# Patient Record
Sex: Female | Born: 1950
Health system: Southern US, Community
[De-identification: ages and names within clinical notes are randomized; demographics above are authoritative.]

## PROBLEM LIST (undated history)

## (undated) DIAGNOSIS — R42 Dizziness and giddiness: Secondary | ICD-10-CM

## (undated) DIAGNOSIS — L4050 Arthropathic psoriasis, unspecified: Secondary | ICD-10-CM

## (undated) DIAGNOSIS — R202 Paresthesia of skin: Secondary | ICD-10-CM

## (undated) DIAGNOSIS — E7800 Pure hypercholesterolemia, unspecified: Secondary | ICD-10-CM

## (undated) DIAGNOSIS — L409 Psoriasis, unspecified: Secondary | ICD-10-CM

## (undated) DIAGNOSIS — M7730 Calcaneal spur, unspecified foot: Secondary | ICD-10-CM

## (undated) DIAGNOSIS — C4490 Unspecified malignant neoplasm of skin, unspecified: Secondary | ICD-10-CM

## (undated) DIAGNOSIS — M1990 Unspecified osteoarthritis, unspecified site: Secondary | ICD-10-CM

## (undated) DIAGNOSIS — E079 Disorder of thyroid, unspecified: Secondary | ICD-10-CM

## (undated) DIAGNOSIS — G4730 Sleep apnea, unspecified: Secondary | ICD-10-CM

## (undated) DIAGNOSIS — M722 Plantar fascial fibromatosis: Secondary | ICD-10-CM

## (undated) HISTORY — DX: Calcaneal spur, unspecified foot: M77.30

## (undated) HISTORY — DX: Plantar fascial fibromatosis: M72.2

## (undated) HISTORY — PX: ABDOMINAL HYSTERECTOMY: SHX81

## (undated) HISTORY — DX: Psoriasis, unspecified: L40.9

## (undated) HISTORY — DX: Unspecified malignant neoplasm of skin, unspecified: C44.90

## (undated) HISTORY — DX: Paresthesia of skin: R20.2

## (undated) HISTORY — DX: Arthropathic psoriasis, unspecified: L40.50

## (undated) HISTORY — DX: Sleep apnea, unspecified: G47.30

## (undated) HISTORY — DX: Disorder of thyroid, unspecified: E07.9

## (undated) HISTORY — DX: Unspecified osteoarthritis, unspecified site: M19.90

## (undated) HISTORY — PX: BACK SURGERY: SHX140

## (undated) HISTORY — DX: Pure hypercholesterolemia, unspecified: E78.00

## (undated) HISTORY — DX: Dizziness and giddiness: R42

---

## 1990-06-05 HISTORY — PX: ABDOMINAL HYSTERECTOMY: SHX81

## 1992-06-04 HISTORY — PX: BACK SURGERY: SHX140

## 1999-01-20 ENCOUNTER — Other Ambulatory Visit: Admission: RE | Admit: 1999-01-20 | Discharge: 1999-01-20 | Payer: Self-pay | Admitting: Obstetrics and Gynecology

## 2000-02-11 ENCOUNTER — Other Ambulatory Visit: Admission: RE | Admit: 2000-02-11 | Discharge: 2000-02-11 | Payer: Self-pay | Admitting: Obstetrics and Gynecology

## 2000-09-16 ENCOUNTER — Ambulatory Visit (HOSPITAL_COMMUNITY): Admission: RE | Admit: 2000-09-16 | Discharge: 2000-09-16 | Payer: Self-pay | Admitting: Internal Medicine

## 2000-09-16 ENCOUNTER — Encounter: Payer: Self-pay | Admitting: Internal Medicine

## 2001-02-21 ENCOUNTER — Encounter: Payer: Self-pay | Admitting: Obstetrics and Gynecology

## 2001-02-21 ENCOUNTER — Ambulatory Visit (HOSPITAL_COMMUNITY): Admission: RE | Admit: 2001-02-21 | Discharge: 2001-02-21 | Payer: Self-pay | Admitting: Obstetrics and Gynecology

## 2001-03-20 ENCOUNTER — Other Ambulatory Visit: Admission: RE | Admit: 2001-03-20 | Discharge: 2001-03-20 | Payer: Self-pay | Admitting: Obstetrics and Gynecology

## 2002-03-14 ENCOUNTER — Encounter: Payer: Self-pay | Admitting: Obstetrics and Gynecology

## 2002-03-14 ENCOUNTER — Ambulatory Visit (HOSPITAL_COMMUNITY): Admission: RE | Admit: 2002-03-14 | Discharge: 2002-03-14 | Payer: Self-pay | Admitting: Obstetrics and Gynecology

## 2003-04-12 ENCOUNTER — Ambulatory Visit (HOSPITAL_COMMUNITY): Admission: RE | Admit: 2003-04-12 | Discharge: 2003-04-12 | Payer: Self-pay | Admitting: Obstetrics and Gynecology

## 2003-10-04 ENCOUNTER — Emergency Department (HOSPITAL_COMMUNITY): Admission: EM | Admit: 2003-10-04 | Discharge: 2003-10-04 | Payer: Self-pay | Admitting: Gastroenterology

## 2004-10-06 ENCOUNTER — Ambulatory Visit (HOSPITAL_COMMUNITY): Admission: RE | Admit: 2004-10-06 | Discharge: 2004-10-06 | Payer: Self-pay | Admitting: Obstetrics and Gynecology

## 2005-10-22 ENCOUNTER — Ambulatory Visit (HOSPITAL_COMMUNITY): Admission: RE | Admit: 2005-10-22 | Discharge: 2005-10-22 | Payer: Self-pay | Admitting: Obstetrics and Gynecology

## 2006-11-21 ENCOUNTER — Ambulatory Visit (HOSPITAL_COMMUNITY): Admission: RE | Admit: 2006-11-21 | Discharge: 2006-11-21 | Payer: Self-pay | Admitting: Obstetrics and Gynecology

## 2006-12-07 ENCOUNTER — Ambulatory Visit (HOSPITAL_COMMUNITY): Admission: RE | Admit: 2006-12-07 | Discharge: 2006-12-07 | Payer: Self-pay | Admitting: Obstetrics and Gynecology

## 2008-07-09 ENCOUNTER — Ambulatory Visit (HOSPITAL_COMMUNITY): Admission: RE | Admit: 2008-07-09 | Discharge: 2008-07-09 | Payer: Self-pay | Admitting: Obstetrics and Gynecology

## 2009-07-25 ENCOUNTER — Ambulatory Visit (HOSPITAL_COMMUNITY): Admission: RE | Admit: 2009-07-25 | Discharge: 2009-07-25 | Payer: Self-pay | Admitting: Obstetrics and Gynecology

## 2009-11-11 ENCOUNTER — Ambulatory Visit (HOSPITAL_COMMUNITY): Admission: RE | Admit: 2009-11-11 | Discharge: 2009-11-11 | Payer: Self-pay | Admitting: Orthopedic Surgery

## 2009-11-14 ENCOUNTER — Encounter (HOSPITAL_COMMUNITY)
Admission: RE | Admit: 2009-11-14 | Discharge: 2009-12-14 | Payer: Self-pay | Source: Home / Self Care | Attending: Orthopedic Surgery | Admitting: Orthopedic Surgery

## 2009-12-17 ENCOUNTER — Encounter (HOSPITAL_COMMUNITY)
Admission: RE | Admit: 2009-12-17 | Discharge: 2010-01-16 | Payer: Self-pay | Source: Home / Self Care | Attending: Orthopedic Surgery | Admitting: Orthopedic Surgery

## 2010-01-26 ENCOUNTER — Encounter: Payer: Self-pay | Admitting: Obstetrics and Gynecology

## 2010-01-27 ENCOUNTER — Encounter (HOSPITAL_COMMUNITY)
Admission: RE | Admit: 2010-01-27 | Discharge: 2010-02-03 | Payer: Self-pay | Source: Home / Self Care | Attending: Orthopedic Surgery | Admitting: Orthopedic Surgery

## 2010-02-05 ENCOUNTER — Encounter (HOSPITAL_COMMUNITY)
Admission: RE | Admit: 2010-02-05 | Discharge: 2010-02-05 | Disposition: A | Discharge status: Home / Self Care | Payer: BC Managed Care – PPO | Source: Ambulatory Visit | Attending: Orthopedic Surgery | Admitting: Orthopedic Surgery

## 2010-02-05 DIAGNOSIS — IMO0001 Reserved for inherently not codable concepts without codable children: Secondary | ICD-10-CM | POA: Insufficient documentation

## 2010-02-05 DIAGNOSIS — M6281 Muscle weakness (generalized): Secondary | ICD-10-CM | POA: Insufficient documentation

## 2010-02-05 DIAGNOSIS — M25519 Pain in unspecified shoulder: Secondary | ICD-10-CM | POA: Insufficient documentation

## 2010-02-05 DIAGNOSIS — M7500 Adhesive capsulitis of unspecified shoulder: Secondary | ICD-10-CM | POA: Insufficient documentation

## 2010-02-09 ENCOUNTER — Ambulatory Visit (HOSPITAL_COMMUNITY): Payer: Self-pay | Admitting: Occupational Therapy

## 2010-02-18 ENCOUNTER — Ambulatory Visit (HOSPITAL_COMMUNITY)
Admission: RE | Admit: 2010-02-18 | Discharge: 2010-02-18 | Disposition: A | Discharge status: Home / Self Care | Payer: BC Managed Care – PPO | Source: Ambulatory Visit | Attending: Orthopedic Surgery | Admitting: Orthopedic Surgery

## 2010-02-18 DIAGNOSIS — M25619 Stiffness of unspecified shoulder, not elsewhere classified: Secondary | ICD-10-CM | POA: Insufficient documentation

## 2010-02-18 DIAGNOSIS — M6281 Muscle weakness (generalized): Secondary | ICD-10-CM | POA: Insufficient documentation

## 2010-02-18 DIAGNOSIS — M25519 Pain in unspecified shoulder: Secondary | ICD-10-CM | POA: Insufficient documentation

## 2010-02-18 DIAGNOSIS — IMO0001 Reserved for inherently not codable concepts without codable children: Secondary | ICD-10-CM | POA: Insufficient documentation

## 2010-02-25 ENCOUNTER — Ambulatory Visit (HOSPITAL_COMMUNITY): Payer: BC Managed Care – PPO | Admitting: Specialist

## 2010-09-14 ENCOUNTER — Other Ambulatory Visit (HOSPITAL_COMMUNITY): Payer: Self-pay | Admitting: Obstetrics and Gynecology

## 2010-09-14 DIAGNOSIS — Z139 Encounter for screening, unspecified: Secondary | ICD-10-CM

## 2010-09-25 ENCOUNTER — Ambulatory Visit (HOSPITAL_COMMUNITY)
Admission: RE | Admit: 2010-09-25 | Discharge: 2010-09-25 | Disposition: A | Discharge status: Home / Self Care | Payer: BC Managed Care – PPO | Source: Ambulatory Visit | Attending: Obstetrics and Gynecology | Admitting: Obstetrics and Gynecology

## 2010-09-25 DIAGNOSIS — Z1231 Encounter for screening mammogram for malignant neoplasm of breast: Secondary | ICD-10-CM | POA: Insufficient documentation

## 2010-09-25 DIAGNOSIS — Z139 Encounter for screening, unspecified: Secondary | ICD-10-CM

## 2010-10-15 ENCOUNTER — Ambulatory Visit: Payer: BC Managed Care – PPO | Admitting: Orthopedic Surgery

## 2010-10-29 ENCOUNTER — Ambulatory Visit: Payer: BC Managed Care – PPO | Admitting: Orthopedic Surgery

## 2011-04-01 ENCOUNTER — Ambulatory Visit (HOSPITAL_COMMUNITY)
Admission: RE | Admit: 2011-04-01 | Discharge: 2011-04-01 | Disposition: A | Discharge status: Home / Self Care | Payer: BC Managed Care – PPO | Source: Ambulatory Visit | Attending: Orthopedic Surgery | Admitting: Orthopedic Surgery

## 2011-04-01 DIAGNOSIS — M6281 Muscle weakness (generalized): Secondary | ICD-10-CM | POA: Insufficient documentation

## 2011-04-01 DIAGNOSIS — IMO0001 Reserved for inherently not codable concepts without codable children: Secondary | ICD-10-CM | POA: Insufficient documentation

## 2011-04-01 DIAGNOSIS — M25619 Stiffness of unspecified shoulder, not elsewhere classified: Secondary | ICD-10-CM | POA: Insufficient documentation

## 2011-04-01 DIAGNOSIS — M25519 Pain in unspecified shoulder: Secondary | ICD-10-CM | POA: Insufficient documentation

## 2011-04-01 NOTE — Evaluation (Signed)
Physical Therapy Evaluation  Patient Details  Name: CARISA BACKHAUS MRN: [id number] Date of Birth: 09-20-1950  Today's Date: 04/01/2011 Time: 1115-1200 Time Calculation (min): 45 min  Visit#: 1  of 12   Re-eval: 05/01/11 Assessment Diagnosis: R shoulder manipulation  Surgical Date: 03/31/11 Prior Therapy: Conservative Treatment using PT and Chiropractic   Subjective Symptoms/Limitations Symptoms: Pt reports that she had her Rt shoulder manipulation yesterday.  She had a history of Left shoulder manipulation Feb 2012.  R shoulder started to freeze in Septemeber.  Currently taking a hydorcodone to control her pain.  She complains of vertigo symptoms which are positional.  Pain Assessment Currently in Pain?: Yes Pain Score:   2 Pain Location: Shoulder Pain Orientation: Right Pain Type: Acute pain;Surgical pain  Prior Functioning  Prior Function Leisure: Hobbies-yes (Comment) Comments: Recently retired.  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  She enjoys outdoor yard work/gardening.   Cognition/Observation Observation/Other Assessments Observations: Scapular Dyskinesis. Impaired UT activation  Assessment RUE AROM (degrees) RUE Overall AROM Comments: Decreased awareness of shoulder stabilizers Shoulder Flexion: 137 Degrees; ABduction: 120 Degrees; Internal Rotation:  L5 w/lumbar flexion; External Rotation: 30 Degrees RUE PROM (degrees) Shoulder Flexion: 150 Degrees; ABductio: 130 Degree; Internal Rotation: 30 Degrees; External Rotation: 45 Degrees RUE Strength Right Shoulder Flexion: 3/5; Extension: 3/5; ABduction: 3/5;Internal Rotation: 4/5; External Rotation: 4/5  Palpation: Muscular spasms to UT and deltor  Exercise/Treatments Supine External Rotation: AAROM;15 reps (#1 Dowel) Flexion: AAROM;15 reps (#1 dowel)  Manual Therapy Manual Therapy: Joint mobilization Joint Mobilization: R GH Grade II-III to increase flexion and ER.  PROM completed in all directions x30  minutes  Physical Therapy Assessment and Plan PT Assessment and Plan Clinical Impression Statement: Pt is a 61 year old female referred to PT s/p R shoulder manipulation on 03/31/10.  After examination it was found that she has current impairments including increased shoulder pain, decreased R shoulder AROM & strength, R scapular dyskinesis and  impaired motor control of shoulder and scapular stabalizers which are limiting her ability to complete necessary ADL's and IADL's.  She will benefit from skilled OP PT in order to address above impairments to reach functional goals.  Rehab Potential: Good PT Frequency: Min 3X/week PT Duration: 8 weeks PT Treatment/Interventions: Functional mobility training;Therapeutic activities;Therapeutic exercise;Neuromuscular re-education;Patient/family education;Other (comment) (Joint Mobilization, Manual Techniques, Modalities PRN) PT Plan: Taper down to 2x/wk as needed.  Add: UBE for ROM, continue to work and focus on PROM until she gains FROM    Goals Home Exercise Program Pt will Perform Home Exercise Program: Independently PT Goal: Perform Home Exercise Program - Progress: Goal set today PT Short Term Goals Time to Complete Short Term Goals: 2 weeks PT Short Term Goal 1: Pt will improve her AROM to: flexion: 150, abduction: 130 degrees, ER: 35 degrees, IR: L4 spinal level PT Short Term Goal 2: Pt will improve her shoulder strength by 1/2 muscle grade.  PT Short Term Goal 3: Pt will report greater ease with donning UE clothing.  PT Short Term Goal 4: Pt will present with proper shoulder mechanics when lifting a 1# weight from counter to cabinet.  PT Long Term Goals Time to Complete Long Term Goals: 8 weeks PT Long Term Goal 1: Pt will improve her strength to [facility] and demonstrate lifting a 15# weight to chest level with BUE to comfortably be able to play with her grandbaby. PT Long Term Goal 2: Pt will improve her shoulder AROM: Flexion: 160, Abduction: 150,  ER: 60, IR: T10 spinal level in order to complete necessary ADL's including lifting dishes from counter to cabinet and brushing her hair. Long Term Goal 3: Pt will report pain less than or equal to 3/10 for 75% of her day for improved QOL.   Problem List Patient Active Problem List  Diagnoses  . Shoulder pain  . Shoulder stiffness  . Muscle weakness (generalized)    PT Plan of Care PT Home Exercise Plan: Supine: Shoulder flexion and ER with dowel rod, pt reports she has these exercises at home and will complete them.  Consulted and Agree with Plan of Care: Patient  Tykeem Lanzer 04/01/2011, 12:17 PM  Physician Documentation Your signature is required to indicate approval of the treatment plan as stated above.  Please sign and either send electronically or make a copy of this report for your files and return this physician signed original.   Please mark one 1.__approve of plan  2. ___approve of plan with the following conditions.   ______________________________                                                          _____________________ Physician Signature                                                                                                             Date

## 2011-04-02 ENCOUNTER — Ambulatory Visit (HOSPITAL_COMMUNITY)
Admission: RE | Admit: 2011-04-02 | Discharge: 2011-04-02 | Disposition: A | Discharge status: Home / Self Care | Payer: BC Managed Care – PPO | Source: Ambulatory Visit | Attending: Orthopedic Surgery | Admitting: Orthopedic Surgery

## 2011-04-02 NOTE — Progress Notes (Signed)
Physical Therapy Treatment Patient Details  Name: ZULAY CORRIE MRN: [id number] Date of Birth: 1950/02/19  Today's Date: 04/02/2011 Time: 1120-1150 Time Calculation (min): 30 min Charges: manual 20', 10 TE Visit#: 2  of 12   Re-eval: 05/01/11    Subjective: Symptoms/Limitations Symptoms: I am doing pretty well today.  I had a little pain last night, but it wasn't bad.  Pain Assessment Currently in Pain?: Yes Pain Score:   2  Exercise/Treatments Supine Other Supine Exercises: Star Gazers 10x10 sec holds each directio Other Supine Exercises: D2 Flexion and Extension 10x each direction  ROM / Strengthening / Isometric Strengthening UBE (Upper Arm Bike): 4 min (2' back, 2' forward)   Manual Therapy Joint Mobilization: GH Grade II-IV to increase ER, flexion w/PROM to follow.  20 minutes.   Physical Therapy Assessment and Plan PT Assessment and Plan Clinical Impression Statement: Pt had improved shoulder ER after treatment today. Tolerated treatment well without an increase in pain.  PT Plan: Cont to progress    Goals    Problem List Patient Active Problem List  Diagnoses  . Shoulder pain  . Shoulder stiffness  . Muscle weakness (generalized)   Tonika Eden 04/02/2011, 12:03 PM

## 2011-11-08 ENCOUNTER — Other Ambulatory Visit (HOSPITAL_COMMUNITY): Payer: Self-pay | Admitting: Obstetrics and Gynecology

## 2011-11-08 DIAGNOSIS — Z139 Encounter for screening, unspecified: Secondary | ICD-10-CM

## 2011-11-12 ENCOUNTER — Ambulatory Visit (HOSPITAL_COMMUNITY)
Admission: RE | Admit: 2011-11-12 | Discharge: 2011-11-12 | Disposition: A | Discharge status: Home / Self Care | Payer: BC Managed Care – PPO | Source: Ambulatory Visit | Attending: Obstetrics and Gynecology | Admitting: Obstetrics and Gynecology

## 2011-11-12 DIAGNOSIS — Z139 Encounter for screening, unspecified: Secondary | ICD-10-CM

## 2011-11-12 DIAGNOSIS — Z1231 Encounter for screening mammogram for malignant neoplasm of breast: Secondary | ICD-10-CM | POA: Insufficient documentation

## 2012-11-27 ENCOUNTER — Other Ambulatory Visit (HOSPITAL_COMMUNITY): Payer: Self-pay | Admitting: Obstetrics and Gynecology

## 2012-11-27 DIAGNOSIS — Z139 Encounter for screening, unspecified: Secondary | ICD-10-CM

## 2012-11-28 ENCOUNTER — Ambulatory Visit (HOSPITAL_COMMUNITY)
Admission: RE | Admit: 2012-11-28 | Discharge: 2012-11-28 | Disposition: A | Discharge status: Home / Self Care | Payer: BC Managed Care – PPO | Source: Ambulatory Visit | Attending: Obstetrics and Gynecology | Admitting: Obstetrics and Gynecology

## 2012-11-28 DIAGNOSIS — Z1231 Encounter for screening mammogram for malignant neoplasm of breast: Secondary | ICD-10-CM | POA: Insufficient documentation

## 2012-11-28 DIAGNOSIS — Z139 Encounter for screening, unspecified: Secondary | ICD-10-CM

## 2014-01-08 ENCOUNTER — Other Ambulatory Visit (HOSPITAL_COMMUNITY): Payer: Self-pay | Admitting: Family Medicine

## 2014-01-08 DIAGNOSIS — Z1231 Encounter for screening mammogram for malignant neoplasm of breast: Secondary | ICD-10-CM

## 2014-01-10 ENCOUNTER — Ambulatory Visit (HOSPITAL_COMMUNITY)
Admission: RE | Admit: 2014-01-10 | Discharge: 2014-01-10 | Disposition: A | Discharge status: Home / Self Care | Payer: BC Managed Care – PPO | Source: Ambulatory Visit | Attending: Family Medicine | Admitting: Family Medicine

## 2014-01-10 DIAGNOSIS — Z1231 Encounter for screening mammogram for malignant neoplasm of breast: Secondary | ICD-10-CM | POA: Diagnosis present

## 2015-01-31 ENCOUNTER — Other Ambulatory Visit (HOSPITAL_COMMUNITY): Payer: Self-pay | Admitting: Obstetrics and Gynecology

## 2015-01-31 DIAGNOSIS — Z1231 Encounter for screening mammogram for malignant neoplasm of breast: Secondary | ICD-10-CM

## 2015-02-10 ENCOUNTER — Ambulatory Visit (HOSPITAL_COMMUNITY): Payer: BC Managed Care – PPO

## 2015-02-12 ENCOUNTER — Ambulatory Visit (HOSPITAL_COMMUNITY)
Admission: RE | Admit: 2015-02-12 | Discharge: 2015-02-12 | Disposition: A | Discharge status: Home / Self Care | Payer: BC Managed Care – PPO | Source: Ambulatory Visit | Attending: Obstetrics and Gynecology | Admitting: Obstetrics and Gynecology

## 2015-02-12 DIAGNOSIS — Z1231 Encounter for screening mammogram for malignant neoplasm of breast: Secondary | ICD-10-CM | POA: Insufficient documentation

## 2015-02-13 ENCOUNTER — Ambulatory Visit (HOSPITAL_COMMUNITY): Payer: BC Managed Care – PPO

## 2016-03-31 ENCOUNTER — Other Ambulatory Visit (HOSPITAL_COMMUNITY): Payer: Self-pay | Admitting: Family Medicine

## 2016-03-31 DIAGNOSIS — Z1231 Encounter for screening mammogram for malignant neoplasm of breast: Secondary | ICD-10-CM

## 2016-04-08 ENCOUNTER — Ambulatory Visit (HOSPITAL_COMMUNITY): Payer: BC Managed Care – PPO

## 2016-04-12 ENCOUNTER — Ambulatory Visit (HOSPITAL_COMMUNITY)
Admission: RE | Admit: 2016-04-12 | Discharge: 2016-04-12 | Disposition: A | Discharge status: Home / Self Care | Payer: Medicare Other | Source: Ambulatory Visit | Attending: Family Medicine | Admitting: Family Medicine

## 2016-04-12 DIAGNOSIS — Z1231 Encounter for screening mammogram for malignant neoplasm of breast: Secondary | ICD-10-CM | POA: Diagnosis present

## 2016-08-24 ENCOUNTER — Emergency Department (HOSPITAL_COMMUNITY): Payer: Medicare Other

## 2016-08-24 ENCOUNTER — Emergency Department (HOSPITAL_COMMUNITY)
Admission: EM | Admit: 2016-08-24 | Discharge: 2016-08-24 | Disposition: A | Discharge status: Home / Self Care | Payer: Medicare Other | Attending: Emergency Medicine | Admitting: Emergency Medicine

## 2016-08-24 ENCOUNTER — Encounter (HOSPITAL_COMMUNITY): Payer: Self-pay

## 2016-08-24 DIAGNOSIS — M545 Low back pain: Secondary | ICD-10-CM | POA: Diagnosis present

## 2016-08-24 DIAGNOSIS — M5136 Other intervertebral disc degeneration, lumbar region: Secondary | ICD-10-CM | POA: Diagnosis not present

## 2016-08-24 DIAGNOSIS — M51369 Other intervertebral disc degeneration, lumbar region without mention of lumbar back pain or lower extremity pain: Secondary | ICD-10-CM

## 2016-08-24 MED ORDER — DEXAMETHASONE 4 MG PO TABS: 4.0000 mg | ORAL_TABLET | Freq: Two times a day (BID) | ORAL | 0 refills | Status: DC

## 2016-08-24 MED ORDER — DEXAMETHASONE SODIUM PHOSPHATE 10 MG/ML IJ SOLN
10.0000 mg | Freq: Once | INTRAMUSCULAR | Status: AC
  Administered 2016-08-24: 10 mg via INTRAMUSCULAR
  Filled 2016-08-24: qty 1

## 2016-08-24 MED ORDER — HYDROCODONE-ACETAMINOPHEN 5-325 MG PO TABS
2.0000 | ORAL_TABLET | Freq: Once | ORAL | Status: AC
  Administered 2016-08-24: 2 via ORAL
  Filled 2016-08-24: qty 2

## 2016-08-24 MED ORDER — METHOCARBAMOL 500 MG PO TABS: 500.0000 mg | ORAL_TABLET | Freq: Three times a day (TID) | ORAL | 0 refills | Status: DC

## 2016-08-24 MED ORDER — ONDANSETRON HCL 4 MG PO TABS
4.0000 mg | ORAL_TABLET | Freq: Once | ORAL | Status: AC
  Administered 2016-08-24: 4 mg via ORAL
  Filled 2016-08-24: qty 1

## 2016-08-24 MED ORDER — DIAZEPAM 5 MG PO TABS
5.0000 mg | ORAL_TABLET | Freq: Once | ORAL | Status: AC
  Administered 2016-08-24: 5 mg via ORAL
  Filled 2016-08-24: qty 1

## 2016-08-24 MED ORDER — HYDROCODONE-ACETAMINOPHEN 5-325 MG PO TABS: 1.0000 | ORAL_TABLET | ORAL | 0 refills | Status: DC | PRN

## 2016-08-24 NOTE — ED Provider Notes (Signed)
Medical screening examination/treatment/procedure(s) were performed by non-physician practitioner and as supervising physician I was immediately available for consultation/collaboration.   EKG Interpretation None      Pt with pain in lower back.  pe tender mild right lumbar spine.     Milton Ferguson, MD 08/24/16 1059

## 2016-08-24 NOTE — ED Notes (Signed)
Pt returned from CT °

## 2016-08-24 NOTE — Discharge Instructions (Addendum)
Your imaging study is negative for fracture or dislocation. It does shoe a disc bulge and protrusion at the L4-L5 area. Please discuss this with Dr Hilma Favors or a member of his team as soon as possible concerning your back. Heating pad may be helpful. Use decadron daily with food. Use Norco and robaxin with caution at these medications may cause drowsiness.

## 2016-08-24 NOTE — ED Triage Notes (Signed)
Pt reports she started having pain in lower back after bending over and picking up something Thursday.  Reports pain has gotten progressively worse since then.  Denies any numbness in extremities, denies any problems with bowels or bladder.

## 2016-08-24 NOTE — ED Provider Notes (Signed)
[facility] DEPT Provider Note   CSN: [id number] Arrival date & time: 08/24/16  0746     History   Chief Complaint Chief Complaint  Patient presents with  . Back Pain    HPI Savannah Watson is a 66 y.o. female.  The history is provided by the patient.  Back Pain   The current episode started more than 2 days ago. The problem occurs hourly. The problem has been gradually worsening. The pain is associated with lifting heavy objects. The pain is present in the lumbar spine. The quality of the pain is described as shooting and aching. The pain does not radiate. The pain is moderate. Exacerbated by: cdertain movement. The pain is the same all the time. Pertinent negatives include no chest pain, no fever, no numbness, no abdominal pain, no bowel incontinence, no perianal numbness, no bladder incontinence, no dysuria and no weakness. She has tried heat and NSAIDs for the symptoms.    History reviewed. No pertinent past medical history.  Patient Active Problem List   Diagnosis Date Noted  . Shoulder pain 04/01/2011  . Shoulder stiffness 04/01/2011  . Muscle weakness (generalized) 04/01/2011    Past Surgical History:  Procedure Laterality Date  . ABDOMINAL HYSTERECTOMY    . BACK SURGERY      OB History    No data available       Home Medications    Prior to Admission medications   Not on File    Family History No family history on file.  Social History Social History  Substance Use Topics  . Smoking status: Never Smoker  . Smokeless tobacco: Never Used  . Alcohol use No     Allergies   Erythromycin   Review of Systems Review of Systems  Constitutional: Negative for activity change and fever.       All ROS Neg except as noted in HPI  HENT: Negative for nosebleeds.   Eyes: Negative for photophobia and discharge.  Respiratory: Negative for cough, shortness of breath and wheezing.   Cardiovascular: Negative for chest pain and palpitations.    Gastrointestinal: Negative for abdominal pain, blood in stool and bowel incontinence.  Genitourinary: Negative for bladder incontinence, dysuria, frequency and hematuria.  Musculoskeletal: Positive for arthralgias and back pain. Negative for neck pain.  Skin: Negative.   Neurological: Negative for dizziness, seizures, speech difficulty, weakness and numbness.  Psychiatric/Behavioral: Negative for confusion and hallucinations.     Physical Exam Updated Vital Signs BP (!) 160/68 (BP Location: Left Arm)   Pulse 71   Temp 97.8 F (36.6 C) (Oral)   Resp 20   Ht 5\' 4"  (1.626 m)   Wt 63.5 kg (140 lb)   SpO2 97%   BMI 24.03 kg/m   Physical Exam  Constitutional: She is oriented to person, place, and time. She appears well-developed and well-nourished.  Non-toxic appearance.  HENT:  Head: Normocephalic.  Right Ear: Tympanic membrane and external ear normal.  Left Ear: Tympanic membrane and external ear normal.  Eyes: Pupils are equal, round, and reactive to light. EOM and lids are normal.  Neck: Normal range of motion. Neck supple. Carotid bruit is not present.  Cardiovascular: Normal rate, regular rhythm, normal heart sounds, intact distal pulses and normal pulses.   Pulmonary/Chest: Breath sounds normal. No respiratory distress.  Abdominal: Soft. Bowel sounds are normal. There is no tenderness. There is no guarding.  Musculoskeletal:       Lumbar back: She exhibits decreased range of motion and pain.  Lymphadenopathy:       Head (right side): No submandibular adenopathy present.       Head (left side): No submandibular adenopathy present.    She has no cervical adenopathy.  Neurological: She is alert and oriented to person, place, and time. She has normal strength. No cranial nerve deficit or sensory deficit.  Skin: Skin is warm and dry.  Psychiatric: She has a normal mood and affect. Her speech is normal.  Nursing note and vitals reviewed.    ED Treatments / Results   Labs (all labs ordered are listed, but only abnormal results are displayed) Labs Reviewed - No data to display  EKG  EKG Interpretation None       Radiology No results found.  Procedures Procedures (including critical care time)  Medications Ordered in ED Medications - No data to display   Initial Impression / Assessment and Plan / ED Course  I have reviewed the triage vital signs and the nursing notes.  Pertinent labs & imaging results that were available during my care of the patient were reviewed by me and considered in my medical decision making (see chart for details).       Final Clinical Impressions(s) / ED Diagnoses MDM Vital signs reviewed.no pulsatile mass appreciated. No unusual abdominal pain, no leg weakness. No history of previous aneurysm. The CT L spine reveals DDD with disc bulge and right lateral disc protrusion present. No exam evidence of cauda equina or other emergent changes. Pt will follow up with Dr Hilma Favors in the office. Rx for Decadron, robaxin and norco, given to the patient.   Final diagnoses:  DDD (degenerative disc disease), lumbar  Acute midline low back pain, with sciatica presence unspecified    New Prescriptions New Prescriptions   No medications on file     Lily Kocher, Hershal Coria 08/24/16 1104    Milton Ferguson, MD 08/25/16 [phone]

## 2017-05-31 ENCOUNTER — Other Ambulatory Visit (HOSPITAL_COMMUNITY): Payer: Self-pay | Admitting: Family Medicine

## 2017-05-31 DIAGNOSIS — Z1231 Encounter for screening mammogram for malignant neoplasm of breast: Secondary | ICD-10-CM

## 2017-06-01 ENCOUNTER — Ambulatory Visit (HOSPITAL_COMMUNITY)
Admission: RE | Admit: 2017-06-01 | Discharge: 2017-06-01 | Disposition: A | Discharge status: Home / Self Care | Payer: Medicare Other | Source: Ambulatory Visit | Attending: Family Medicine | Admitting: Family Medicine

## 2017-06-01 ENCOUNTER — Encounter (HOSPITAL_COMMUNITY): Payer: Self-pay

## 2017-06-01 DIAGNOSIS — Z1231 Encounter for screening mammogram for malignant neoplasm of breast: Secondary | ICD-10-CM | POA: Diagnosis not present

## 2018-07-05 ENCOUNTER — Other Ambulatory Visit (HOSPITAL_COMMUNITY): Payer: Self-pay | Admitting: Family Medicine

## 2018-07-05 DIAGNOSIS — Z1231 Encounter for screening mammogram for malignant neoplasm of breast: Secondary | ICD-10-CM

## 2018-10-23 ENCOUNTER — Ambulatory Visit (HOSPITAL_COMMUNITY): Payer: Medicare Other

## 2018-11-20 ENCOUNTER — Ambulatory Visit (HOSPITAL_COMMUNITY): Payer: Medicare Other

## 2019-01-08 ENCOUNTER — Other Ambulatory Visit (HOSPITAL_COMMUNITY): Payer: Self-pay | Admitting: Physician Assistant

## 2019-01-08 DIAGNOSIS — Z1231 Encounter for screening mammogram for malignant neoplasm of breast: Secondary | ICD-10-CM

## 2019-01-15 ENCOUNTER — Other Ambulatory Visit: Payer: Self-pay

## 2019-01-15 ENCOUNTER — Ambulatory Visit (HOSPITAL_COMMUNITY)
Admission: RE | Admit: 2019-01-15 | Discharge: 2019-01-15 | Disposition: A | Discharge status: Home / Self Care | Payer: Medicare PPO | Source: Ambulatory Visit | Attending: Physician Assistant | Admitting: Physician Assistant

## 2019-01-15 DIAGNOSIS — Z1231 Encounter for screening mammogram for malignant neoplasm of breast: Secondary | ICD-10-CM | POA: Diagnosis not present

## 2019-02-14 DIAGNOSIS — Z1389 Encounter for screening for other disorder: Secondary | ICD-10-CM | POA: Diagnosis not present

## 2019-02-14 DIAGNOSIS — G4733 Obstructive sleep apnea (adult) (pediatric): Secondary | ICD-10-CM | POA: Diagnosis not present

## 2019-02-14 DIAGNOSIS — E039 Hypothyroidism, unspecified: Secondary | ICD-10-CM | POA: Diagnosis not present

## 2019-02-14 DIAGNOSIS — Z6826 Body mass index (BMI) 26.0-26.9, adult: Secondary | ICD-10-CM | POA: Diagnosis not present

## 2019-02-14 DIAGNOSIS — L4050 Arthropathic psoriasis, unspecified: Secondary | ICD-10-CM | POA: Diagnosis not present

## 2019-02-14 DIAGNOSIS — Z0001 Encounter for general adult medical examination with abnormal findings: Secondary | ICD-10-CM | POA: Diagnosis not present

## 2019-03-22 DIAGNOSIS — M25571 Pain in right ankle and joints of right foot: Secondary | ICD-10-CM | POA: Diagnosis not present

## 2019-03-22 DIAGNOSIS — M25572 Pain in left ankle and joints of left foot: Secondary | ICD-10-CM | POA: Diagnosis not present

## 2019-03-22 DIAGNOSIS — M79672 Pain in left foot: Secondary | ICD-10-CM | POA: Diagnosis not present

## 2019-03-22 DIAGNOSIS — M19072 Primary osteoarthritis, left ankle and foot: Secondary | ICD-10-CM | POA: Diagnosis not present

## 2019-03-22 DIAGNOSIS — M549 Dorsalgia, unspecified: Secondary | ICD-10-CM | POA: Diagnosis not present

## 2019-03-22 DIAGNOSIS — M79671 Pain in right foot: Secondary | ICD-10-CM | POA: Diagnosis not present

## 2019-03-22 DIAGNOSIS — Z79899 Other long term (current) drug therapy: Secondary | ICD-10-CM | POA: Diagnosis not present

## 2019-03-22 DIAGNOSIS — M1990 Unspecified osteoarthritis, unspecified site: Secondary | ICD-10-CM | POA: Diagnosis not present

## 2019-03-22 DIAGNOSIS — L4050 Arthropathic psoriasis, unspecified: Secondary | ICD-10-CM | POA: Diagnosis not present

## 2019-03-22 DIAGNOSIS — M19071 Primary osteoarthritis, right ankle and foot: Secondary | ICD-10-CM | POA: Diagnosis not present

## 2019-03-22 DIAGNOSIS — M79604 Pain in right leg: Secondary | ICD-10-CM | POA: Diagnosis not present

## 2019-03-29 ENCOUNTER — Other Ambulatory Visit: Payer: Self-pay

## 2019-03-29 ENCOUNTER — Ambulatory Visit (INDEPENDENT_AMBULATORY_CARE_PROVIDER_SITE_OTHER): Payer: Medicare PPO

## 2019-03-29 ENCOUNTER — Ambulatory Visit: Payer: Medicare PPO | Admitting: Orthopaedic Surgery

## 2019-03-29 ENCOUNTER — Encounter: Payer: Self-pay | Admitting: Orthopaedic Surgery

## 2019-03-29 VITALS — Ht 64.0 in | Wt 149.0 lb

## 2019-03-29 DIAGNOSIS — M722 Plantar fascial fibromatosis: Secondary | ICD-10-CM

## 2019-03-29 NOTE — Progress Notes (Signed)
Office Visit Note   Patient: Savannah Watson           Date of Birth: [birth year]           MRN: [id number] Visit Date: 03/29/2019              Requested by: Sharilyn Sites, [address] [place],  [place] [address] PCP: Sharilyn Sites, MD   Assessment & Plan: Visit Diagnoses:  1. Plantar fasciitis of right foot     Plan: Impression is chronic right plantar fasciitis.  Treatment options were discussed at length today to include a cortisone injection, physical therapy, over-the-counter straps and custom arch supports.  Patient would like to trial all the above except for cortisone injection.  Questions encouraged and answered.  I defer the bilateral calf pain and suspected restless leg syndrome to her PCP. Total face to face encounter time was greater than 45 minutes and over half of this time was spent in counseling and/or coordination of care.  Follow-Up Instructions: Return if symptoms worsen or fail to improve.   Orders:  Orders Placed This Encounter  Procedures  . XR Os Calcis Right   No orders of the defined types were placed in this encounter.     Procedures: No procedures performed   Clinical Data: No additional findings.   Subjective: Chief Complaint  Patient presents with  . Right Foot - Pain    Patient is a 69 year old female comes in for evaluation of right heel pain for 3 months.  She has pain on the bottom of the heel that radiates down to the foot.  The pain will vary at times depending on the activity.  A prednisone dose pack helped only temporarily.  Tylenol does not help.  She does have psoriatic arthritis.  She is also had bilateral calf pain for the last year that her PCP thinks is restless leg syndrome.   Review of Systems  Constitutional: Negative.   HENT: Negative.   Eyes: Negative.   Respiratory: Negative.   Cardiovascular: Negative.   Endocrine: Negative.   Musculoskeletal: Negative.   Neurological: Negative.   Hematological:  Negative.   Psychiatric/Behavioral: Negative.   All other systems reviewed and are negative.    Objective: Vital Signs: Ht 5\' 4"  (1.626 m)   Wt 149 lb (67.6 kg)   BMI 25.58 kg/m   Physical Exam Vitals and nursing note reviewed.  Constitutional:      Appearance: She is well-developed.  HENT:     Head: Normocephalic and atraumatic.  Pulmonary:     Effort: Pulmonary effort is normal.  Abdominal:     Palpations: Abdomen is soft.  Musculoskeletal:     Cervical back: Neck supple.  Skin:    General: Skin is warm.     Capillary Refill: Capillary refill takes less than 2 seconds.  Neurological:     Mental Status: She is alert and oriented to person, place, and time.  Psychiatric:        Behavior: Behavior normal.        Thought Content: Thought content normal.        Judgment: Judgment normal.     Ortho Exam Right foot and heel are tender to the heel on the plantar aspect.  Achilles insertion is nontender or swollen.  Lateral compression of the calcaneus elicits mild pain.  No Achilles contracture. Specialty Comments:  No specialty comments available.  Imaging: XR Os Calcis Right  Result Date: 03/29/2019 No acute abnormalities. Small plantar  calcification at the plantar fascia insertion.    PMFS History: Patient Active Problem List   Diagnosis Date Noted  . Plantar fasciitis of right foot 03/29/2019  . Shoulder pain 04/01/2011  . Shoulder stiffness 04/01/2011  . Muscle weakness (generalized) 04/01/2011   History reviewed. No pertinent past medical history.  History reviewed. No pertinent family history.  Past Surgical History:  Procedure Laterality Date  . ABDOMINAL HYSTERECTOMY    . BACK SURGERY     Social History   Occupational History  . Not on file  Tobacco Use  . Smoking status: Never Smoker  . Smokeless tobacco: Never Used  Substance and Sexual Activity  . Alcohol use: No  . Drug use: No  . Sexual activity: Not on file

## 2019-04-05 ENCOUNTER — Telehealth: Payer: Self-pay | Admitting: Orthopaedic Surgery

## 2019-04-05 NOTE — Telephone Encounter (Signed)
Did you give them a hard copy? I do not see referral in Epic. If so, what would you like for PT? So that I can fax order to them. Thanks.

## 2019-04-05 NOTE — Telephone Encounter (Signed)
Looks like xu saw her.  Ok to do referral in epic for plantar fasciitis treatment

## 2019-04-05 NOTE — Telephone Encounter (Signed)
Beth from [facility] called.   She said the patient called to make an appointment with them but they have not received an Rx for her. She is requesting it be faxed or given a call.   Call back: [phone]   Fax number: [phone]

## 2019-04-06 DIAGNOSIS — R2689 Other abnormalities of gait and mobility: Secondary | ICD-10-CM | POA: Diagnosis not present

## 2019-04-06 DIAGNOSIS — M722 Plantar fascial fibromatosis: Secondary | ICD-10-CM | POA: Diagnosis not present

## 2019-04-06 DIAGNOSIS — M79671 Pain in right foot: Secondary | ICD-10-CM | POA: Diagnosis not present

## 2019-04-06 DIAGNOSIS — M79672 Pain in left foot: Secondary | ICD-10-CM | POA: Diagnosis not present

## 2019-04-10 ENCOUNTER — Other Ambulatory Visit: Payer: Self-pay

## 2019-04-10 DIAGNOSIS — M722 Plantar fascial fibromatosis: Secondary | ICD-10-CM

## 2019-04-10 DIAGNOSIS — M79672 Pain in left foot: Secondary | ICD-10-CM | POA: Diagnosis not present

## 2019-04-10 DIAGNOSIS — R2689 Other abnormalities of gait and mobility: Secondary | ICD-10-CM | POA: Diagnosis not present

## 2019-04-10 DIAGNOSIS — M79671 Pain in right foot: Secondary | ICD-10-CM | POA: Diagnosis not present

## 2019-04-10 NOTE — Telephone Encounter (Signed)
FAXED

## 2019-04-13 DIAGNOSIS — M722 Plantar fascial fibromatosis: Secondary | ICD-10-CM | POA: Diagnosis not present

## 2019-04-13 DIAGNOSIS — M79672 Pain in left foot: Secondary | ICD-10-CM | POA: Diagnosis not present

## 2019-04-13 DIAGNOSIS — M79671 Pain in right foot: Secondary | ICD-10-CM | POA: Diagnosis not present

## 2019-04-13 DIAGNOSIS — R2689 Other abnormalities of gait and mobility: Secondary | ICD-10-CM | POA: Diagnosis not present

## 2019-04-17 DIAGNOSIS — M722 Plantar fascial fibromatosis: Secondary | ICD-10-CM | POA: Diagnosis not present

## 2019-04-17 DIAGNOSIS — R2689 Other abnormalities of gait and mobility: Secondary | ICD-10-CM | POA: Diagnosis not present

## 2019-04-17 DIAGNOSIS — M79671 Pain in right foot: Secondary | ICD-10-CM | POA: Diagnosis not present

## 2019-04-17 DIAGNOSIS — M79672 Pain in left foot: Secondary | ICD-10-CM | POA: Diagnosis not present

## 2019-04-20 DIAGNOSIS — R2689 Other abnormalities of gait and mobility: Secondary | ICD-10-CM | POA: Diagnosis not present

## 2019-04-20 DIAGNOSIS — M79672 Pain in left foot: Secondary | ICD-10-CM | POA: Diagnosis not present

## 2019-04-20 DIAGNOSIS — M722 Plantar fascial fibromatosis: Secondary | ICD-10-CM | POA: Diagnosis not present

## 2019-04-20 DIAGNOSIS — M79671 Pain in right foot: Secondary | ICD-10-CM | POA: Diagnosis not present

## 2019-04-23 DIAGNOSIS — M722 Plantar fascial fibromatosis: Secondary | ICD-10-CM | POA: Diagnosis not present

## 2019-04-23 DIAGNOSIS — R2689 Other abnormalities of gait and mobility: Secondary | ICD-10-CM | POA: Diagnosis not present

## 2019-04-23 DIAGNOSIS — M79672 Pain in left foot: Secondary | ICD-10-CM | POA: Diagnosis not present

## 2019-04-23 DIAGNOSIS — M79671 Pain in right foot: Secondary | ICD-10-CM | POA: Diagnosis not present

## 2019-05-01 DIAGNOSIS — M79671 Pain in right foot: Secondary | ICD-10-CM | POA: Diagnosis not present

## 2019-05-01 DIAGNOSIS — R2689 Other abnormalities of gait and mobility: Secondary | ICD-10-CM | POA: Diagnosis not present

## 2019-05-01 DIAGNOSIS — M722 Plantar fascial fibromatosis: Secondary | ICD-10-CM | POA: Diagnosis not present

## 2019-05-01 DIAGNOSIS — M79672 Pain in left foot: Secondary | ICD-10-CM | POA: Diagnosis not present

## 2019-05-10 DIAGNOSIS — R2689 Other abnormalities of gait and mobility: Secondary | ICD-10-CM | POA: Diagnosis not present

## 2019-05-10 DIAGNOSIS — M79671 Pain in right foot: Secondary | ICD-10-CM | POA: Diagnosis not present

## 2019-05-10 DIAGNOSIS — M79672 Pain in left foot: Secondary | ICD-10-CM | POA: Diagnosis not present

## 2019-05-10 DIAGNOSIS — M722 Plantar fascial fibromatosis: Secondary | ICD-10-CM | POA: Diagnosis not present

## 2019-05-18 DIAGNOSIS — M722 Plantar fascial fibromatosis: Secondary | ICD-10-CM | POA: Diagnosis not present

## 2019-05-18 DIAGNOSIS — R2689 Other abnormalities of gait and mobility: Secondary | ICD-10-CM | POA: Diagnosis not present

## 2019-05-18 DIAGNOSIS — M79672 Pain in left foot: Secondary | ICD-10-CM | POA: Diagnosis not present

## 2019-05-18 DIAGNOSIS — M79671 Pain in right foot: Secondary | ICD-10-CM | POA: Diagnosis not present

## 2019-05-29 DIAGNOSIS — Z8679 Personal history of other diseases of the circulatory system: Secondary | ICD-10-CM | POA: Diagnosis not present

## 2019-05-29 DIAGNOSIS — Z139 Encounter for screening, unspecified: Secondary | ICD-10-CM | POA: Diagnosis not present

## 2019-05-31 DIAGNOSIS — M722 Plantar fascial fibromatosis: Secondary | ICD-10-CM | POA: Diagnosis not present

## 2019-05-31 DIAGNOSIS — M79671 Pain in right foot: Secondary | ICD-10-CM | POA: Diagnosis not present

## 2019-05-31 DIAGNOSIS — R2689 Other abnormalities of gait and mobility: Secondary | ICD-10-CM | POA: Diagnosis not present

## 2019-05-31 DIAGNOSIS — M79672 Pain in left foot: Secondary | ICD-10-CM | POA: Diagnosis not present

## 2019-06-19 ENCOUNTER — Ambulatory Visit: Payer: Medicare PPO | Admitting: Orthopaedic Surgery

## 2019-06-21 DIAGNOSIS — M79672 Pain in left foot: Secondary | ICD-10-CM | POA: Diagnosis not present

## 2019-06-21 DIAGNOSIS — M79671 Pain in right foot: Secondary | ICD-10-CM | POA: Diagnosis not present

## 2019-06-21 DIAGNOSIS — R2689 Other abnormalities of gait and mobility: Secondary | ICD-10-CM | POA: Diagnosis not present

## 2019-06-21 DIAGNOSIS — M722 Plantar fascial fibromatosis: Secondary | ICD-10-CM | POA: Diagnosis not present

## 2019-07-03 DIAGNOSIS — Z79899 Other long term (current) drug therapy: Secondary | ICD-10-CM | POA: Diagnosis not present

## 2019-07-03 DIAGNOSIS — M549 Dorsalgia, unspecified: Secondary | ICD-10-CM | POA: Diagnosis not present

## 2019-07-03 DIAGNOSIS — M79671 Pain in right foot: Secondary | ICD-10-CM | POA: Diagnosis not present

## 2019-07-03 DIAGNOSIS — L4050 Arthropathic psoriasis, unspecified: Secondary | ICD-10-CM | POA: Diagnosis not present

## 2019-07-03 DIAGNOSIS — M1990 Unspecified osteoarthritis, unspecified site: Secondary | ICD-10-CM | POA: Diagnosis not present

## 2019-07-06 DIAGNOSIS — G4733 Obstructive sleep apnea (adult) (pediatric): Secondary | ICD-10-CM | POA: Diagnosis not present

## 2019-07-10 DIAGNOSIS — M722 Plantar fascial fibromatosis: Secondary | ICD-10-CM | POA: Diagnosis not present

## 2019-07-10 DIAGNOSIS — M79671 Pain in right foot: Secondary | ICD-10-CM | POA: Diagnosis not present

## 2019-07-31 DIAGNOSIS — M79671 Pain in right foot: Secondary | ICD-10-CM | POA: Diagnosis not present

## 2019-07-31 DIAGNOSIS — M722 Plantar fascial fibromatosis: Secondary | ICD-10-CM | POA: Diagnosis not present

## 2019-08-15 DIAGNOSIS — Z20828 Contact with and (suspected) exposure to other viral communicable diseases: Secondary | ICD-10-CM | POA: Diagnosis not present

## 2019-08-20 DIAGNOSIS — Z20828 Contact with and (suspected) exposure to other viral communicable diseases: Secondary | ICD-10-CM | POA: Diagnosis not present

## 2019-08-22 DIAGNOSIS — H2513 Age-related nuclear cataract, bilateral: Secondary | ICD-10-CM | POA: Diagnosis not present

## 2019-08-22 DIAGNOSIS — H43391 Other vitreous opacities, right eye: Secondary | ICD-10-CM | POA: Diagnosis not present

## 2019-08-22 DIAGNOSIS — H538 Other visual disturbances: Secondary | ICD-10-CM | POA: Diagnosis not present

## 2019-08-22 DIAGNOSIS — H52222 Regular astigmatism, left eye: Secondary | ICD-10-CM | POA: Diagnosis not present

## 2019-08-22 DIAGNOSIS — H5201 Hypermetropia, right eye: Secondary | ICD-10-CM | POA: Diagnosis not present

## 2019-08-22 DIAGNOSIS — H524 Presbyopia: Secondary | ICD-10-CM | POA: Diagnosis not present

## 2019-08-22 DIAGNOSIS — H5212 Myopia, left eye: Secondary | ICD-10-CM | POA: Diagnosis not present

## 2019-08-22 DIAGNOSIS — H25013 Cortical age-related cataract, bilateral: Secondary | ICD-10-CM | POA: Diagnosis not present

## 2019-09-21 ENCOUNTER — Ambulatory Visit: Payer: Self-pay | Admitting: Neurology

## 2019-10-01 DIAGNOSIS — M79671 Pain in right foot: Secondary | ICD-10-CM | POA: Diagnosis not present

## 2019-10-01 DIAGNOSIS — Z79899 Other long term (current) drug therapy: Secondary | ICD-10-CM | POA: Diagnosis not present

## 2019-10-01 DIAGNOSIS — R202 Paresthesia of skin: Secondary | ICD-10-CM | POA: Diagnosis not present

## 2019-10-01 DIAGNOSIS — M1990 Unspecified osteoarthritis, unspecified site: Secondary | ICD-10-CM | POA: Diagnosis not present

## 2019-10-01 DIAGNOSIS — M549 Dorsalgia, unspecified: Secondary | ICD-10-CM | POA: Diagnosis not present

## 2019-10-01 DIAGNOSIS — L4050 Arthropathic psoriasis, unspecified: Secondary | ICD-10-CM | POA: Diagnosis not present

## 2019-10-03 ENCOUNTER — Other Ambulatory Visit: Payer: Self-pay | Admitting: *Deleted

## 2019-10-03 ENCOUNTER — Encounter: Payer: Self-pay | Admitting: *Deleted

## 2019-10-04 ENCOUNTER — Ambulatory Visit (INDEPENDENT_AMBULATORY_CARE_PROVIDER_SITE_OTHER): Payer: Medicare PPO | Admitting: Neurology

## 2019-10-04 ENCOUNTER — Encounter: Payer: Self-pay | Admitting: Neurology

## 2019-10-04 VITALS — BP 151/71 | HR 65 | Ht 64.0 in | Wt 144.5 lb

## 2019-10-04 DIAGNOSIS — R202 Paresthesia of skin: Secondary | ICD-10-CM

## 2019-10-04 DIAGNOSIS — E538 Deficiency of other specified B group vitamins: Secondary | ICD-10-CM | POA: Diagnosis not present

## 2019-10-04 MED ORDER — GABAPENTIN 300 MG PO CAPS: 300.0000 mg | ORAL_CAPSULE | Freq: Every day | ORAL | 2 refills | Status: DC

## 2019-10-04 NOTE — Progress Notes (Signed)
Reason for visit: Paresthesias  Referring physician: Dr. Payton Emerald is a 69 y.o. female  History of present illness:  Ms. Skilton is a 69 year old right-handed white female with a history of spontaneous onset of paresthesias that involve the hands first beginning around 05 June 2019.  The patient does have a history of psoriatic arthritis that has been treated for several years.  About 2 weeks after onset of numbness and paresthesias in the hands, she began noting similar problems in the feet that have gradually worsened over time and have spread up the legs to the knees.  The sensations in the arms of spread up to the elbows.  The patient does believe that these sensations are worsening over time.  She does not report any significant changes in balance.  She has a long-term history of vertigo associated with benign positional vertigo, this has not been a big issue for her recently.  She denies any neck pain or pain down the arms.  She does have a history of right-sided sciatica in the past but claims that she had MRI of the lumbar spine done that was completely unremarkable.  The patient has an achy sensation in the legs that she has been told is related to restless leg syndrome, her sister has similar issues.  Her sister also has psoriatic arthritis.  The patient denies any weakness of the extremities and she denies issues controlling the bowels or the bladder.  She is sent to this office for evaluation of the above paresthesias.  She was started on [place] in August 2021.  Past Medical History:  Diagnosis Date  . Arthritis   . Heel spur    L/R  . High cholesterol   . Plantar fasciitis   . Psoriasis   . Psoriatic arthritis ([facility])   . Skin cancer   . Sleep apnea   . Thyroid disorder   . Tingling of both feet   . Vertigo     Past Surgical History:  Procedure Laterality Date  . ABDOMINAL HYSTERECTOMY  06/1990  . BACK SURGERY  06/1992    Family History  Problem Relation Age of  Onset  . Heart failure Mother   . Alcoholism Mother   . Alcoholism Father   . Bipolar disorder Sister   . Scoliosis Sister   . Kidney Stones Sister     Social history:  reports that she has never smoked. She has never used smokeless tobacco. She reports that she does not drink alcohol and does not use drugs.  Medications:  Prior to Admission medications   Medication Sig Start Date End Date Taking? Authorizing Provider  Apremilast (OTEZLA) 30 MG TABS Take 30 mg by mouth 2 (two) times daily.   Yes [provider]  levothyroxine (SYNTHROID) 75 MCG tablet daily.   Yes [provider]  meloxicam (MOBIC) 15 MG tablet Mobic 15 mg tablet  Take 1 tablet every day by oral route.   Yes [provider]  omeprazole (PRILOSEC) 20 MG capsule Take 20 mg by mouth as needed. 04/09/19  Yes [provider]  sulfaSALAzine (AZULFIDINE) 500 MG EC tablet sulfasalazine 500 mg tablet,delayed release   Yes [provider]      Allergies  Allergen Reactions  . Erythromycin Nausea Only    ROS:  Out of a complete 14 system review of symptoms, the patient complains only of the following symptoms, and all other reviewed systems are negative.  Numbness of all 4 extremities  Blood pressure (!) 151/71, pulse 65, height 5\' 4"  (1.626 m), weight 144 lb 8 oz (65.5 kg).  Physical Exam  General: The patient is alert and cooperative at the time of the examination.  Eyes: Pupils are equal, round, and reactive to light. Discs are flat bilaterally.  Neck: The neck is supple, no carotid bruits are noted.  Respiratory: The respiratory examination is clear.  Cardiovascular: The cardiovascular examination reveals a regular rate and rhythm, no obvious murmurs or rubs are noted.  Skin: Extremities are without significant edema.  Neurologic Exam  Mental status: The patient is alert and oriented x 3 at the time of the examination. The patient has apparent normal recent and  remote memory, with an apparently normal attention span and concentration ability.  Cranial nerves: Facial symmetry is present. There is good sensation of the face to pinprick and soft touch bilaterally. The strength of the facial muscles and the muscles to head turning and shoulder shrug are normal bilaterally. Speech is well enunciated, no aphasia or dysarthria is noted. Extraocular movements are full. Visual fields are full. The tongue is midline, and the patient has symmetric elevation of the soft palate. No obvious hearing deficits are noted.  Motor: The motor testing reveals 5 over 5 strength of all 4 extremities. Good symmetric motor tone is noted throughout.  Sensory: Sensory testing is intact to pinprick, soft touch, vibration sensation, and position sense on all 4 extremities. No evidence of extinction is noted.  Coordination: Cerebellar testing reveals good finger-nose-finger and heel-to-shin bilaterally.  Gait and station: Gait is normal. Tandem gait is normal. Romberg is negative. No drift is seen.  Reflexes: Deep tendon reflexes are symmetric, but are depressed bilaterally. Toes are downgoing bilaterally.   Assessment/Plan:  1.  Paresthesias, all 4 extremities  2.  History of psoriatic arthritis  The patient is having some discomfort in the legs, she is having difficulty sleeping at night, I will add gabapentin 300 mg in the evening.  The started having symptoms first in the hands and then the feet which is unusual for most peripheral neuropathy problems, will need to exclude a metabolic issue such as a vitamin B12 deficiency or copper deficiency.  The patient will be sent for blood work today, she will have nerve conduction studies done on one arm and both legs, EMG on 1 leg.  The patient will have MRI of the cervical spine if the above studies are unrevealing.  She will follow-up here otherwise in 4 months.  Jill Alexanders MD 10/04/2019 8:40 AM  [facility] [address] [place], [place] [address]  Phone [phone] Fax [phone]

## 2019-10-08 NOTE — Progress Notes (Signed)
Low normal B 12 level. Normal sed rate, ACE , RF, non reactive RPR, ANA, Lyme negative (ab test).    Pending are EMG, NCV and protein e'phoresis.   Full results by next week.

## 2019-10-09 LAB — MULTIPLE MYELOMA PANEL, SERUM
Albumin SerPl Elph-Mcnc: 4 g/dL (ref 2.9–4.4)
Albumin/Glob SerPl: 1.2 (ref 0.7–1.7)
Alpha 1: 0.3 g/dL (ref 0.0–0.4)
Alpha2 Glob SerPl Elph-Mcnc: 0.8 g/dL (ref 0.4–1.0)
B-Globulin SerPl Elph-Mcnc: 1.3 g/dL (ref 0.7–1.3)
Gamma Glob SerPl Elph-Mcnc: 1.1 g/dL (ref 0.4–1.8)
Globulin, Total: 3.5 g/dL (ref 2.2–3.9)
IgA/Immunoglobulin A, Serum: 126 mg/dL (ref 87–352)
IgG (Immunoglobin G), Serum: 1067 mg/dL (ref 586–1602)
IgM (Immunoglobulin M), Srm: 66 mg/dL (ref 26–217)
Total Protein: 7.5 g/dL (ref 6.0–8.5)

## 2019-10-09 LAB — ANA W/REFLEX: Anti Nuclear Antibody (ANA): NEGATIVE

## 2019-10-09 LAB — VITAMIN B12: Vitamin B-12: 360 pg/mL (ref 232–1245)

## 2019-10-09 LAB — SEDIMENTATION RATE: Sed Rate: 14 mm/hr (ref 0–40)

## 2019-10-09 LAB — B. BURGDORFI ANTIBODIES: Lyme IgG/IgM Ab: <0.91 {ISR} (ref 0.00–0.90)

## 2019-10-09 LAB — RPR: RPR Ser Ql: NONREACTIVE

## 2019-10-09 LAB — COPPER, SERUM: Copper: 138 ug/dL (ref 80–158)

## 2019-10-09 LAB — ANGIOTENSIN CONVERTING ENZYME: Angio Convert Enzyme: 38 U/L (ref 14–82)

## 2019-10-09 LAB — RHEUMATOID FACTOR: Rheumatoid fact SerPl-aCnc: 10.1 IU/mL (ref 0.0–13.9)

## 2019-10-10 ENCOUNTER — Telehealth: Payer: Self-pay

## 2019-10-10 NOTE — Telephone Encounter (Signed)
-----   Message from Kathrynn Ducking, MD sent at 10/09/2019 12:13 PM EDT -----  The blood work results are unremarkable. Please call the patient. ----- Message ----- From: Lavone Neri Lab Results In Sent: 10/05/2019   7:39 AM EDT To: Kathrynn Ducking, MD

## 2019-10-10 NOTE — Telephone Encounter (Signed)
Pt verified by name and DOB,  normal results given per provider, pt voiced understanding all question answered.  Pt is concerned about NCS/emg appt date,  Informed her I will check and see if another time/date is available

## 2019-10-15 ENCOUNTER — Encounter: Payer: Medicare PPO | Admitting: Neurology

## 2019-10-29 ENCOUNTER — Encounter: Payer: Medicare PPO | Admitting: Neurology

## 2019-11-12 ENCOUNTER — Encounter: Payer: Self-pay | Admitting: Neurology

## 2019-11-12 ENCOUNTER — Ambulatory Visit: Payer: Medicare PPO | Admitting: Neurology

## 2019-11-12 ENCOUNTER — Telehealth: Payer: Self-pay | Admitting: Neurology

## 2019-11-12 ENCOUNTER — Other Ambulatory Visit: Payer: Self-pay

## 2019-11-12 ENCOUNTER — Ambulatory Visit (INDEPENDENT_AMBULATORY_CARE_PROVIDER_SITE_OTHER): Payer: Medicare PPO | Admitting: Neurology

## 2019-11-12 DIAGNOSIS — R202 Paresthesia of skin: Secondary | ICD-10-CM | POA: Diagnosis not present

## 2019-11-12 MED ORDER — GABAPENTIN 300 MG PO CAPS: 600.0000 mg | ORAL_CAPSULE | Freq: Every day | ORAL | 2 refills | Status: DC

## 2019-11-12 NOTE — Progress Notes (Addendum)
The patient comes in for EMG nerve conduction study evaluation.  Nerve conductions of the legs are unremarkable, cannot exclude a small fiber neuropathy.  EMG of the right lower extremity is unremarkable, the patient has had prior lumbar spine surgery.  We will go ahead and check MRI of the cervical spine as previously discussed.  The patient wants a study done at [facility]  The patient is on 300 mg gabapentin at night, she is still having difficulty sleeping, she will go up on the dose to 600 mg at night.    [place]    Nerve / Sites Muscle Latency Ref. Amplitude Ref. Rel Amp Segments Distance Velocity Ref. Area    ms ms mV mV %  cm m/s m/s mVms  R Peroneal - EDB     Ankle EDB 5.3 ?6.5 2.3 ?2.0 100 Ankle - EDB 9   8.6     Fib head EDB 10.6  2.3  102 Fib head - Ankle 25 47 ?44 7.3     Pop fossa EDB 12.8  2.4  104 Pop fossa - Fib head 10 46 ?44 7.7         Pop fossa - Ankle      L Peroneal - EDB     Ankle EDB 5.5 ?6.5 3.4 ?2.0 100 Ankle - EDB 9   12.4     Fib head EDB 11.5  2.8  83.8 Fib head - Ankle 26 44 ?44 11.7     Pop fossa EDB 13.8  2.7  94.9 Pop fossa - Fib head 10 44 ?44 11.4         Pop fossa - Ankle      R Tibial - AH     Ankle AH 4.4 ?5.8 4.0 ?4.0 100 Ankle - AH 9   10.8     Pop fossa AH 13.4  3.6  90.1 Pop fossa - Ankle 37 41 ?41 11.6  L Tibial - AH     Ankle AH 5.5 ?5.8 7.2 ?4.0 100 Ankle - AH 9   17.3     Pop fossa AH 14.1  4.0  55.9 Pop fossa - Ankle 37 43 ?41 17.8             SNC    Nerve / Sites Rec. Site Peak Lat Ref.  Amp Ref. Segments Distance    ms ms V V  cm  R Sural - Ankle (Calf)     Calf Ankle 4.4 ?4.4 2 ?6 Calf - Ankle 14  L Sural - Ankle (Calf)     Calf Ankle 3.9 ?4.4 2 ?6 Calf - Ankle 14  R Superficial peroneal - Ankle     Lat leg Ankle 3.9 ?4.4 6 ?6 Lat leg - Ankle 14  L Superficial peroneal - Ankle     Lat leg Ankle 3.0 ?4.4 3 ?6 Lat leg - Ankle 14             F  Wave    Nerve F Lat Ref.   ms ms  R Tibial - AH 54.0 ?56.0  L Tibial -  AH 55.5 ?56.0

## 2019-11-12 NOTE — Procedures (Signed)
     HISTORY:  Savannah Watson is a 69 year old patient with a history of onset of numbness and tingling in the hands followed by similar complaints in the lower extremities bilaterally.  The patient is undergoing evaluation for possible a peripheral neuropathy or a lumbosacral radiculopathy.  NERVE CONDUCTION STUDIES:  Nerve conduction studies were performed on both lower extremities. The distal motor latencies and motor amplitudes for the peroneal and posterior tibial nerves were within normal limits. The nerve conduction velocities for these nerves were also normal. The sensory latencies for the peroneal and sural nerves were within normal limits. The F wave latencies for the posterior tibial nerves were within normal limits.   EMG STUDIES:  EMG study was performed on the right lower extremity:  The tibialis anterior muscle reveals 2 to 4K motor units with full recruitment. No fibrillations or positive waves were seen. The peroneus tertius muscle reveals 2 to 4K motor units with full recruitment. No fibrillations or positive waves were seen. The medial gastrocnemius muscle reveals 1 to 3K motor units with full recruitment. No fibrillations or positive waves were seen. The vastus lateralis muscle reveals 2 to 4K motor units with full recruitment. No fibrillations or positive waves were seen. The iliopsoas muscle reveals 2 to 4K motor units with full recruitment. No fibrillations or positive waves were seen. The biceps femoris muscle (long head) reveals 2 to 4K motor units with full recruitment. No fibrillations or positive waves were seen. The lumbosacral paraspinal muscles were tested at 3 levels, and revealed no abnormalities of insertional activity at all 3 levels tested. There was good relaxation.   IMPRESSION:  Nerve conduction studies done on both lower extremities were within normal limits.  No evidence of a neuropathy was seen.  A small fiber neuropathy cannot be fully excluded by the  study however, clinical correlation is required.  EMG evaluation of the right lower extremity was unremarkable, no evidence of an overlying lumbosacral radiculopathy was seen.  Jill Alexanders MD 11/12/2019 2:24 PM  [facility] [address] [place], [place] [address]  Phone [phone] Fax [phone]

## 2019-11-12 NOTE — Telephone Encounter (Signed)
[employer] pending  

## 2019-11-12 NOTE — Progress Notes (Signed)
Please refer to EMG and nerve conduction procedure note.  

## 2019-11-13 NOTE — Telephone Encounter (Signed)
Savannah Watson Savannah Watson: [id number] (exp. 11/12/19 to 12/12/19)  Patient is scheduled at [facility] for Friday 11/23/19 to arrive at 10:30 AM. I left a voicemail for the patient to be aware. I also left their number of [phone] incase she needed to r/s.

## 2019-11-23 ENCOUNTER — Ambulatory Visit (HOSPITAL_COMMUNITY): Payer: Medicare PPO

## 2019-11-30 ENCOUNTER — Other Ambulatory Visit: Payer: Self-pay

## 2019-11-30 ENCOUNTER — Ambulatory Visit (HOSPITAL_COMMUNITY)
Admission: RE | Admit: 2019-11-30 | Discharge: 2019-11-30 | Disposition: A | Discharge status: Home / Self Care | Payer: Medicare PPO | Source: Ambulatory Visit | Attending: Neurology | Admitting: Neurology

## 2019-11-30 DIAGNOSIS — R202 Paresthesia of skin: Secondary | ICD-10-CM | POA: Insufficient documentation

## 2019-11-30 DIAGNOSIS — M50222 Other cervical disc displacement at C5-C6 level: Secondary | ICD-10-CM | POA: Diagnosis not present

## 2019-11-30 DIAGNOSIS — M50221 Other cervical disc displacement at C4-C5 level: Secondary | ICD-10-CM | POA: Diagnosis not present

## 2019-11-30 DIAGNOSIS — M50223 Other cervical disc displacement at C6-C7 level: Secondary | ICD-10-CM | POA: Diagnosis not present

## 2019-12-01 ENCOUNTER — Telehealth: Payer: Self-pay | Admitting: Neurology

## 2019-12-01 NOTE — Telephone Encounter (Signed)
I called the patient.  MRI of the cervical spine was relatively unremarkable.  The etiology of her paresthesias on all fours is not clear, but she could have a small fiber neuropathy that was not picked up on the nerve conduction study.  We will treat symptomatically at this time.    MRI cervical 11/30/19:  IMPRESSION: 1. No acute osseous abnormality and largely normal for age MRI appearance of the cervical spine. 2. Chronic cervical disc and endplate degeneration maximal at C5-C6 and C6-C7, but no significant spinal stenosis or convincing neural impingement.

## 2019-12-03 ENCOUNTER — Telehealth: Payer: Self-pay | Admitting: Neurology

## 2019-12-03 NOTE — Telephone Encounter (Signed)
Pt is asking for a call to discuss options as a result of the MRI results/  Pt is having pain and discomfort and doesn't want to wait until Feb for some type of relief.  Pt is asking to be called

## 2019-12-04 NOTE — Telephone Encounter (Signed)
I called the patient and left a message.  The MRI of the cervical spine was relatively unremarkable, the cause of her paresthesias is not clear, she may have a small fiber neuropathy.  At any rate, if she is having discomfort she can go up on the gabapentin dose taking 300 mg in the morning and 600 mg in the evening and continue to go up on the dose as long she is tolerating the drug.

## 2019-12-26 ENCOUNTER — Encounter: Payer: Self-pay | Admitting: Neurology

## 2019-12-26 ENCOUNTER — Ambulatory Visit: Payer: Medicare PPO | Admitting: Neurology

## 2019-12-26 VITALS — BP 148/70 | HR 75 | Ht 64.0 in | Wt 143.0 lb

## 2019-12-26 DIAGNOSIS — R202 Paresthesia of skin: Secondary | ICD-10-CM

## 2019-12-26 DIAGNOSIS — M5431 Sciatica, right side: Secondary | ICD-10-CM

## 2019-12-26 MED ORDER — GABAPENTIN 300 MG PO CAPS
900.0000 mg | ORAL_CAPSULE | Freq: Every day | ORAL | 2 refills | Status: DC
Start: 2019-12-26 — End: 2020-09-02

## 2019-12-26 NOTE — Progress Notes (Signed)
Reason for visit: Paresthesias, right leg discomfort  Savannah Watson is an 69 y.o. female  History of present illness:  Savannah Watson is a 69 year old right-handed white female with a history of paresthesias that began in the summer 2021 affecting all 4 extremities.  MRI of the cervical spine was done and did not show an etiology for this issue, blood work was unremarkable.  Over time, the paresthesias have become less significant, the symptoms are intermittent in nature.  She has however had increased discomfort down the right leg from the buttocks down to the foot on the lateral aspect.  Recent EMG and nerve conduction study involving the right leg did not show evidence of radiculopathy.  The patient has had MRI of the lumbar spine done in September 2018, she has had prior surgery at L5-S1 level.  Some scarring around the left S1 nerve root was noted, but no evidence of nerve root impingement on the right was seen.  The patient at times feels as if the right leg is slightly weak.  She is on gabapentin taking 900 mg in the evening.  She notes that the right leg pain is worse when she is sitting, better when she is active, and it is worse at night and may wake her up at night.  Past Medical History:  Diagnosis Date  . Arthritis   . Heel spur    L/R  . High cholesterol   . Plantar fasciitis   . Psoriasis   . Psoriatic arthritis ([place])   . Skin cancer   . Sleep apnea   . Thyroid disorder   . Tingling of both feet   . Vertigo     Past Surgical History:  Procedure Laterality Date  . ABDOMINAL HYSTERECTOMY  06/1990  . BACK SURGERY  06/1992    Family History  Problem Relation Age of Onset  . Heart failure Mother   . Alcoholism Mother   . Alcoholism Father   . Bipolar disorder Sister   . Scoliosis Sister   . Kidney Stones Sister     Social history:  reports that she has never smoked. She has never used smokeless tobacco. She reports that she does not drink alcohol and does not use  drugs.    Allergies  Allergen Reactions  . Erythromycin Nausea Only    Medications:  Prior to Admission medications   Medication Sig Start Date End Date Taking? Authorizing Provider  Apremilast (OTEZLA) 30 MG TABS Take 30 mg by mouth 2 (two) times daily.   Yes [provider]  gabapentin (NEURONTIN) 300 MG capsule Take 2 capsules (600 mg total) by mouth at bedtime. 11/12/19  Yes Kathrynn Ducking, MD  levothyroxine (SYNTHROID) 75 MCG tablet daily.   Yes [provider]  meloxicam (MOBIC) 15 MG tablet Mobic 15 mg tablet  Take 1 tablet every day by oral route.   Yes [provider]  omeprazole (PRILOSEC) 20 MG capsule Take 20 mg by mouth as needed. 04/09/19  Yes [provider]  sulfaSALAzine (AZULFIDINE) 500 MG EC tablet sulfasalazine 500 mg tablet,delayed release   Yes [provider]    ROS:  Out of a complete 14 system review of symptoms, the patient complains only of the following symptoms, and all other reviewed systems are negative.  Right leg pain Paresthesias  Height 5\' 4"  (1.626 m), weight 143 lb (64.9 kg).  Physical Exam  General: The patient is alert and cooperative at the time of the examination.  Neuromuscular: Range move the lumbar spine is relatively full.  Skin: No significant peripheral edema is noted.   Neurologic Exam  Mental status: The patient is alert and oriented x 3 at the time of the examination. The patient has apparent normal recent and remote memory, with an apparently normal attention span and concentration ability.   Cranial nerves: Facial symmetry is present. Speech is normal, no aphasia or dysarthria is noted. Extraocular movements are full. Visual fields are full.  Motor: The patient has good strength in all 4 extremities.  Sensory examination: Soft touch sensation is symmetric on the face, arms, and legs.  Coordination: The patient has good finger-nose-finger and heel-to-shin  bilaterally.  Gait and station: The patient has a normal gait. Tandem gait is normal. Romberg is negative. No drift is seen.  The patient is able to walk on the heels and the toes.  Reflexes: Deep tendon reflexes are symmetric.   MRI cervical 11/30/19:  IMPRESSION: 1. No acute osseous abnormality and largely normal for age MRI appearance of the cervical spine. 2. Chronic cervical disc and endplate degeneration maximal at C5-C6 and C6-C7, but no significant spinal stenosis or convincing neural Impingement.  * MRI scan images were reviewed online. I agree with the written report.    Assessment/Plan:  1.  Paresthesias  2.  Right-sided sciatica  The patient is having ongoing discomfort in the right leg, this has worsened some over time.  I will check MRI of the lumbar spine, if this is unrevealing, we will consider physical therapy.  The patient has had prior lumbosacral spine surgery.  The possibility of SI joint dysfunction on the right side should be considered and could be a source of the right leg pain.  She will follow-up otherwise in 6 months.  A prescription for the gabapentin was sent in.  Jill Alexanders MD 12/26/2019 2:16 PM  [facility] [address] [place], [place] [address]  Phone [phone] Fax [phone]

## 2020-01-02 ENCOUNTER — Telehealth: Payer: Self-pay | Admitting: Neurology

## 2020-01-02 DIAGNOSIS — M6281 Muscle weakness (generalized): Secondary | ICD-10-CM

## 2020-01-02 NOTE — Telephone Encounter (Signed)
Savannah Watson, Patient has her MRI Jan 11 th at [facility] . Patient needs to have a Bun and Creatine first . Patient is aware she can come here and have labs done any day she will wait for your call . Thanks Annabelle Harman     arrive 1:30pm//s/w Annabelle Harman  MRI  approved 12/31/2019 - 01/30/2020 [id number] [facility] . I have talked to patient she is aware of all details

## 2020-01-03 NOTE — Addendum Note (Signed)
Addended by: Alexia Freestone R on: 01/03/2020 11:10 AM   Modules accepted: Orders

## 2020-01-08 ENCOUNTER — Other Ambulatory Visit (INDEPENDENT_AMBULATORY_CARE_PROVIDER_SITE_OTHER): Payer: Self-pay

## 2020-01-08 DIAGNOSIS — M6281 Muscle weakness (generalized): Secondary | ICD-10-CM

## 2020-01-08 DIAGNOSIS — Z0289 Encounter for other administrative examinations: Secondary | ICD-10-CM

## 2020-01-09 ENCOUNTER — Telehealth: Payer: Self-pay | Admitting: Emergency Medicine

## 2020-01-09 LAB — BUN: BUN: 12 mg/dL (ref 8–27)

## 2020-01-09 LAB — CK TOTAL AND CKMB (NOT AT ARMC)
CK-MB Index: 2.8 ng/mL (ref 0.0–5.3)
Total CK: 109 U/L (ref 32–182)

## 2020-01-09 NOTE — Telephone Encounter (Signed)
-----   Message from York Spaniel, MD sent at 01/09/2020  7:04 AM EST ----- Blood work is unremarkable, I suspected that the BUN and creatinine was to be ordered, but instead BUN and a CK level was ordered.  BUN is well within normal limits and I suspect that the kidney function is adequate for MRI.  Please call the patient. ----- Message ----- From: Nell Range Lab Results In Sent: 01/09/2020   5:37 AM EST To: York Spaniel, MD

## 2020-01-09 NOTE — Telephone Encounter (Signed)
Called patient and reviewed Dr. Anne Hahn findings of blood work for patient.  Patient denied any questions and  expressed appreciation.

## 2020-01-15 ENCOUNTER — Ambulatory Visit (HOSPITAL_COMMUNITY): Payer: Medicare PPO

## 2020-01-15 ENCOUNTER — Other Ambulatory Visit: Payer: Self-pay

## 2020-01-15 ENCOUNTER — Ambulatory Visit (HOSPITAL_COMMUNITY)
Admission: RE | Admit: 2020-01-15 | Discharge: 2020-01-15 | Disposition: A | Discharge status: Home / Self Care | Payer: Medicare PPO | Source: Ambulatory Visit | Attending: Neurology | Admitting: Neurology

## 2020-01-15 DIAGNOSIS — M5431 Sciatica, right side: Secondary | ICD-10-CM | POA: Diagnosis not present

## 2020-01-15 DIAGNOSIS — M545 Low back pain, unspecified: Secondary | ICD-10-CM | POA: Diagnosis not present

## 2020-01-15 MED ORDER — GADOBUTROL 1 MMOL/ML IV SOLN
7.0000 mL | Freq: Once | INTRAVENOUS | Status: AC | PRN
  Administered 2020-01-15: 7 mL via INTRAVENOUS

## 2020-01-17 ENCOUNTER — Telehealth: Payer: Self-pay | Admitting: Neurology

## 2020-01-17 DIAGNOSIS — G8929 Other chronic pain: Secondary | ICD-10-CM

## 2020-01-17 DIAGNOSIS — M5441 Lumbago with sciatica, right side: Secondary | ICD-10-CM

## 2020-01-17 NOTE — Telephone Encounter (Signed)
I called the patient.  MRI of the lumbar spine does not show any surgically amenable issues, no evidence of nerve root impingement.  As previously discussed, we will get the patient to physical therapy, she desires to have [facility] in [place], [place].  I will get this set up.    MRI lumbar 01/15/20:  IMPRESSION: 1. No significant interval change in mild degenerative changes of the lumbar spine without significant foraminal or canal stenosis at any level. 2. Previously seen central disc protrusion at L3-4 has resolved. 3. Previous left laminectomy at L5-S1 with minimal perineural scarring around the posterior and posterolateral margins of the left S1 nerve root within the subarticular recess. Unchanged borderline left foraminal narrowing at this level without evidence of impingement.

## 2020-01-18 ENCOUNTER — Ambulatory Visit (HOSPITAL_COMMUNITY): Payer: Medicare PPO

## 2020-01-28 DIAGNOSIS — M25551 Pain in right hip: Secondary | ICD-10-CM | POA: Diagnosis not present

## 2020-01-28 DIAGNOSIS — R531 Weakness: Secondary | ICD-10-CM | POA: Diagnosis not present

## 2020-01-28 DIAGNOSIS — M5441 Lumbago with sciatica, right side: Secondary | ICD-10-CM | POA: Diagnosis not present

## 2020-01-28 DIAGNOSIS — M5442 Lumbago with sciatica, left side: Secondary | ICD-10-CM | POA: Diagnosis not present

## 2020-01-29 DIAGNOSIS — M5441 Lumbago with sciatica, right side: Secondary | ICD-10-CM | POA: Diagnosis not present

## 2020-01-29 DIAGNOSIS — M5442 Lumbago with sciatica, left side: Secondary | ICD-10-CM | POA: Diagnosis not present

## 2020-01-29 DIAGNOSIS — M25551 Pain in right hip: Secondary | ICD-10-CM | POA: Diagnosis not present

## 2020-01-29 DIAGNOSIS — R531 Weakness: Secondary | ICD-10-CM | POA: Diagnosis not present

## 2020-01-31 DIAGNOSIS — M5441 Lumbago with sciatica, right side: Secondary | ICD-10-CM | POA: Diagnosis not present

## 2020-01-31 DIAGNOSIS — R531 Weakness: Secondary | ICD-10-CM | POA: Diagnosis not present

## 2020-01-31 DIAGNOSIS — M5442 Lumbago with sciatica, left side: Secondary | ICD-10-CM | POA: Diagnosis not present

## 2020-01-31 DIAGNOSIS — M25551 Pain in right hip: Secondary | ICD-10-CM | POA: Diagnosis not present

## 2020-02-04 DIAGNOSIS — M5442 Lumbago with sciatica, left side: Secondary | ICD-10-CM | POA: Diagnosis not present

## 2020-02-04 DIAGNOSIS — M5441 Lumbago with sciatica, right side: Secondary | ICD-10-CM | POA: Diagnosis not present

## 2020-02-04 DIAGNOSIS — R531 Weakness: Secondary | ICD-10-CM | POA: Diagnosis not present

## 2020-02-04 DIAGNOSIS — M25551 Pain in right hip: Secondary | ICD-10-CM | POA: Diagnosis not present

## 2020-02-05 ENCOUNTER — Ambulatory Visit: Payer: Medicare PPO | Admitting: Neurology

## 2020-02-06 DIAGNOSIS — G4733 Obstructive sleep apnea (adult) (pediatric): Secondary | ICD-10-CM | POA: Diagnosis not present

## 2020-02-13 DIAGNOSIS — R531 Weakness: Secondary | ICD-10-CM | POA: Diagnosis not present

## 2020-02-13 DIAGNOSIS — M5441 Lumbago with sciatica, right side: Secondary | ICD-10-CM | POA: Diagnosis not present

## 2020-02-13 DIAGNOSIS — M25551 Pain in right hip: Secondary | ICD-10-CM | POA: Diagnosis not present

## 2020-02-13 DIAGNOSIS — M5442 Lumbago with sciatica, left side: Secondary | ICD-10-CM | POA: Diagnosis not present

## 2020-02-18 DIAGNOSIS — M5441 Lumbago with sciatica, right side: Secondary | ICD-10-CM | POA: Diagnosis not present

## 2020-02-18 DIAGNOSIS — R531 Weakness: Secondary | ICD-10-CM | POA: Diagnosis not present

## 2020-02-18 DIAGNOSIS — M25551 Pain in right hip: Secondary | ICD-10-CM | POA: Diagnosis not present

## 2020-02-18 DIAGNOSIS — M5442 Lumbago with sciatica, left side: Secondary | ICD-10-CM | POA: Diagnosis not present

## 2020-02-25 DIAGNOSIS — E039 Hypothyroidism, unspecified: Secondary | ICD-10-CM | POA: Diagnosis not present

## 2020-02-25 DIAGNOSIS — Z0000 Encounter for general adult medical examination without abnormal findings: Secondary | ICD-10-CM | POA: Diagnosis not present

## 2020-02-26 ENCOUNTER — Other Ambulatory Visit (HOSPITAL_COMMUNITY): Payer: Self-pay | Admitting: Family Medicine

## 2020-02-26 DIAGNOSIS — E039 Hypothyroidism, unspecified: Secondary | ICD-10-CM | POA: Diagnosis not present

## 2020-02-26 DIAGNOSIS — E7849 Other hyperlipidemia: Secondary | ICD-10-CM | POA: Diagnosis not present

## 2020-02-26 DIAGNOSIS — L4050 Arthropathic psoriasis, unspecified: Secondary | ICD-10-CM | POA: Diagnosis not present

## 2020-02-26 DIAGNOSIS — G4733 Obstructive sleep apnea (adult) (pediatric): Secondary | ICD-10-CM | POA: Diagnosis not present

## 2020-02-26 DIAGNOSIS — Z0001 Encounter for general adult medical examination with abnormal findings: Secondary | ICD-10-CM | POA: Diagnosis not present

## 2020-02-26 DIAGNOSIS — Z1331 Encounter for screening for depression: Secondary | ICD-10-CM | POA: Diagnosis not present

## 2020-02-26 DIAGNOSIS — Z6824 Body mass index (BMI) 24.0-24.9, adult: Secondary | ICD-10-CM | POA: Diagnosis not present

## 2020-02-26 DIAGNOSIS — Z1231 Encounter for screening mammogram for malignant neoplasm of breast: Secondary | ICD-10-CM

## 2020-02-28 DIAGNOSIS — Z01818 Encounter for other preprocedural examination: Secondary | ICD-10-CM | POA: Diagnosis not present

## 2020-02-28 DIAGNOSIS — Z1211 Encounter for screening for malignant neoplasm of colon: Secondary | ICD-10-CM | POA: Diagnosis not present

## 2020-03-04 DIAGNOSIS — M5441 Lumbago with sciatica, right side: Secondary | ICD-10-CM | POA: Diagnosis not present

## 2020-03-04 DIAGNOSIS — R531 Weakness: Secondary | ICD-10-CM | POA: Diagnosis not present

## 2020-03-04 DIAGNOSIS — M5442 Lumbago with sciatica, left side: Secondary | ICD-10-CM | POA: Diagnosis not present

## 2020-03-04 DIAGNOSIS — M25551 Pain in right hip: Secondary | ICD-10-CM | POA: Diagnosis not present

## 2020-03-07 DIAGNOSIS — K635 Polyp of colon: Secondary | ICD-10-CM | POA: Diagnosis not present

## 2020-03-07 DIAGNOSIS — Z1211 Encounter for screening for malignant neoplasm of colon: Secondary | ICD-10-CM | POA: Diagnosis not present

## 2020-03-07 DIAGNOSIS — Z01818 Encounter for other preprocedural examination: Secondary | ICD-10-CM | POA: Diagnosis not present

## 2020-03-13 DIAGNOSIS — K635 Polyp of colon: Secondary | ICD-10-CM | POA: Diagnosis not present

## 2020-03-14 ENCOUNTER — Ambulatory Visit (HOSPITAL_COMMUNITY)
Admission: RE | Admit: 2020-03-14 | Discharge: 2020-03-14 | Disposition: A | Discharge status: Home / Self Care | Payer: Medicare PPO | Source: Ambulatory Visit | Attending: Family Medicine | Admitting: Family Medicine

## 2020-03-14 DIAGNOSIS — Z1231 Encounter for screening mammogram for malignant neoplasm of breast: Secondary | ICD-10-CM | POA: Diagnosis not present

## 2020-03-17 ENCOUNTER — Telehealth: Payer: Self-pay | Admitting: Emergency Medicine

## 2020-03-17 NOTE — Telephone Encounter (Signed)
Faxed signed POC orders for PT  OK transmission received.

## 2020-04-08 DIAGNOSIS — M5442 Lumbago with sciatica, left side: Secondary | ICD-10-CM | POA: Diagnosis not present

## 2020-04-08 DIAGNOSIS — K648 Other hemorrhoids: Secondary | ICD-10-CM | POA: Diagnosis not present

## 2020-04-08 DIAGNOSIS — R531 Weakness: Secondary | ICD-10-CM | POA: Diagnosis not present

## 2020-04-08 DIAGNOSIS — D126 Benign neoplasm of colon, unspecified: Secondary | ICD-10-CM | POA: Diagnosis not present

## 2020-04-08 DIAGNOSIS — M5441 Lumbago with sciatica, right side: Secondary | ICD-10-CM | POA: Diagnosis not present

## 2020-04-08 DIAGNOSIS — M25551 Pain in right hip: Secondary | ICD-10-CM | POA: Diagnosis not present

## 2020-04-08 DIAGNOSIS — K5909 Other constipation: Secondary | ICD-10-CM | POA: Diagnosis not present

## 2020-04-08 DIAGNOSIS — K5730 Diverticulosis of large intestine without perforation or abscess without bleeding: Secondary | ICD-10-CM | POA: Diagnosis not present

## 2020-04-14 DIAGNOSIS — Z1283 Encounter for screening for malignant neoplasm of skin: Secondary | ICD-10-CM | POA: Diagnosis not present

## 2020-04-14 DIAGNOSIS — D485 Neoplasm of uncertain behavior of skin: Secondary | ICD-10-CM | POA: Diagnosis not present

## 2020-04-14 DIAGNOSIS — X32XXXA Exposure to sunlight, initial encounter: Secondary | ICD-10-CM | POA: Diagnosis not present

## 2020-04-14 DIAGNOSIS — D225 Melanocytic nevi of trunk: Secondary | ICD-10-CM | POA: Diagnosis not present

## 2020-04-14 DIAGNOSIS — Z85820 Personal history of malignant melanoma of skin: Secondary | ICD-10-CM | POA: Diagnosis not present

## 2020-04-14 DIAGNOSIS — L570 Actinic keratosis: Secondary | ICD-10-CM | POA: Diagnosis not present

## 2020-04-14 DIAGNOSIS — Z08 Encounter for follow-up examination after completed treatment for malignant neoplasm: Secondary | ICD-10-CM | POA: Diagnosis not present

## 2020-04-14 NOTE — Telephone Encounter (Signed)
Faxed signed POC for certification period 04/08/20  OK transmission received

## 2020-04-16 DIAGNOSIS — L4050 Arthropathic psoriasis, unspecified: Secondary | ICD-10-CM | POA: Diagnosis not present

## 2020-04-16 DIAGNOSIS — M549 Dorsalgia, unspecified: Secondary | ICD-10-CM | POA: Diagnosis not present

## 2020-04-16 DIAGNOSIS — M1990 Unspecified osteoarthritis, unspecified site: Secondary | ICD-10-CM | POA: Diagnosis not present

## 2020-04-16 DIAGNOSIS — R202 Paresthesia of skin: Secondary | ICD-10-CM | POA: Diagnosis not present

## 2020-04-16 DIAGNOSIS — Z79899 Other long term (current) drug therapy: Secondary | ICD-10-CM | POA: Diagnosis not present

## 2020-04-16 DIAGNOSIS — M79671 Pain in right foot: Secondary | ICD-10-CM | POA: Diagnosis not present

## 2020-05-05 DIAGNOSIS — G4733 Obstructive sleep apnea (adult) (pediatric): Secondary | ICD-10-CM | POA: Diagnosis not present

## 2020-05-07 DIAGNOSIS — L821 Other seborrheic keratosis: Secondary | ICD-10-CM | POA: Diagnosis not present

## 2020-05-07 DIAGNOSIS — M797 Fibromyalgia: Secondary | ICD-10-CM | POA: Diagnosis not present

## 2020-05-07 DIAGNOSIS — D485 Neoplasm of uncertain behavior of skin: Secondary | ICD-10-CM | POA: Diagnosis not present

## 2020-05-07 DIAGNOSIS — M25571 Pain in right ankle and joints of right foot: Secondary | ICD-10-CM | POA: Diagnosis not present

## 2020-05-07 DIAGNOSIS — D2272 Melanocytic nevi of left lower limb, including hip: Secondary | ICD-10-CM | POA: Diagnosis not present

## 2020-05-07 DIAGNOSIS — M25572 Pain in left ankle and joints of left foot: Secondary | ICD-10-CM | POA: Diagnosis not present

## 2020-05-07 DIAGNOSIS — D225 Melanocytic nevi of trunk: Secondary | ICD-10-CM | POA: Diagnosis not present

## 2020-05-07 DIAGNOSIS — M799 Soft tissue disorder, unspecified: Secondary | ICD-10-CM | POA: Diagnosis not present

## 2020-05-16 DIAGNOSIS — M25571 Pain in right ankle and joints of right foot: Secondary | ICD-10-CM | POA: Diagnosis not present

## 2020-05-16 DIAGNOSIS — M799 Soft tissue disorder, unspecified: Secondary | ICD-10-CM | POA: Diagnosis not present

## 2020-05-16 DIAGNOSIS — M25572 Pain in left ankle and joints of left foot: Secondary | ICD-10-CM | POA: Diagnosis not present

## 2020-05-16 DIAGNOSIS — M797 Fibromyalgia: Secondary | ICD-10-CM | POA: Diagnosis not present

## 2020-05-28 DIAGNOSIS — D485 Neoplasm of uncertain behavior of skin: Secondary | ICD-10-CM | POA: Diagnosis not present

## 2020-05-28 DIAGNOSIS — L98499 Non-pressure chronic ulcer of skin of other sites with unspecified severity: Secondary | ICD-10-CM | POA: Diagnosis not present

## 2020-05-28 DIAGNOSIS — D225 Melanocytic nevi of trunk: Secondary | ICD-10-CM | POA: Diagnosis not present

## 2020-05-28 DIAGNOSIS — Z1283 Encounter for screening for malignant neoplasm of skin: Secondary | ICD-10-CM | POA: Diagnosis not present

## 2020-06-09 DIAGNOSIS — L988 Other specified disorders of the skin and subcutaneous tissue: Secondary | ICD-10-CM | POA: Diagnosis not present

## 2020-06-09 DIAGNOSIS — D485 Neoplasm of uncertain behavior of skin: Secondary | ICD-10-CM | POA: Diagnosis not present

## 2020-06-16 DIAGNOSIS — M799 Soft tissue disorder, unspecified: Secondary | ICD-10-CM | POA: Diagnosis not present

## 2020-06-16 DIAGNOSIS — M797 Fibromyalgia: Secondary | ICD-10-CM | POA: Diagnosis not present

## 2020-06-16 DIAGNOSIS — M25571 Pain in right ankle and joints of right foot: Secondary | ICD-10-CM | POA: Diagnosis not present

## 2020-06-16 DIAGNOSIS — M25572 Pain in left ankle and joints of left foot: Secondary | ICD-10-CM | POA: Diagnosis not present

## 2020-06-25 ENCOUNTER — Ambulatory Visit: Payer: Medicare PPO | Admitting: Neurology

## 2020-06-26 DIAGNOSIS — M799 Soft tissue disorder, unspecified: Secondary | ICD-10-CM | POA: Diagnosis not present

## 2020-06-26 DIAGNOSIS — M25572 Pain in left ankle and joints of left foot: Secondary | ICD-10-CM | POA: Diagnosis not present

## 2020-06-26 DIAGNOSIS — M797 Fibromyalgia: Secondary | ICD-10-CM | POA: Diagnosis not present

## 2020-06-26 DIAGNOSIS — M25571 Pain in right ankle and joints of right foot: Secondary | ICD-10-CM | POA: Diagnosis not present

## 2020-06-29 ENCOUNTER — Ambulatory Visit
Admission: EM | Admit: 2020-06-29 | Discharge: 2020-06-29 | Disposition: A | Discharge status: Home / Self Care | Payer: Medicare PPO | Attending: Emergency Medicine | Admitting: Emergency Medicine

## 2020-06-29 ENCOUNTER — Encounter: Payer: Self-pay | Admitting: Emergency Medicine

## 2020-06-29 ENCOUNTER — Ambulatory Visit: Payer: Self-pay

## 2020-06-29 ENCOUNTER — Other Ambulatory Visit: Payer: Self-pay

## 2020-06-29 DIAGNOSIS — R110 Nausea: Secondary | ICD-10-CM | POA: Diagnosis not present

## 2020-06-29 DIAGNOSIS — J069 Acute upper respiratory infection, unspecified: Secondary | ICD-10-CM | POA: Diagnosis not present

## 2020-06-29 DIAGNOSIS — Z20822 Contact with and (suspected) exposure to covid-19: Secondary | ICD-10-CM

## 2020-06-29 MED ORDER — IBUPROFEN 600 MG PO TABS: 600.0000 mg | ORAL_TABLET | Freq: Four times a day (QID) | ORAL | 0 refills | Status: AC | PRN

## 2020-06-29 MED ORDER — ONDANSETRON 4 MG PO TBDP
4.0000 mg | ORAL_TABLET | Freq: Once | ORAL | Status: AC
  Administered 2020-06-29: 4 mg via ORAL

## 2020-06-29 MED ORDER — ONDANSETRON 4 MG PO TBDP: 4.0000 mg | ORAL_TABLET | Freq: Three times a day (TID) | ORAL | 0 refills | Status: DC | PRN

## 2020-06-29 MED ORDER — FLUTICASONE PROPIONATE 50 MCG/ACT NA SUSP: 2.0000 | Freq: Every day | NASAL | 0 refills | Status: DC

## 2020-06-29 NOTE — Discharge Instructions (Addendum)
May take 600 mg of ibuprofen combined with 1000 mg of Tylenol together 3-4 times a day as needed for body aches, headache.  Saline nasal irrigation, Flonase.  We will contact you if your COVID is positive and will call in Paxlovid for you.

## 2020-06-29 NOTE — ED Triage Notes (Signed)
Pt presents with nausea and feeling unwell, husband is positive for covid

## 2020-06-29 NOTE — ED Provider Notes (Addendum)
HPI  SUBJECTIVE:  Savannah Watson is a 70 y.o. female who presents with nausea, headache, fatigue, body aches, nasal congestion, scratchy sore throat, cough starting yesterday.  No fevers, rhinorrhea, loss of sense of smell or taste, shortness of breath, vomiting, diarrhea, abdominal pain.  Husband was diagnosed with COVID yesterday.  No antipyretic in the past 6 hours.  She has tried zinc, vitamin C and D without improvement in her symptoms.  She was given Zofran here with resolution of her nausea.  No aggravating factors.  She has got the first COVID booster.  She has a past medical history of psoriatic arthritis and is on DMARD/immunosuppressant.  No history of chronic kidney disease.  PMD: [facility].   Past Medical History:  Diagnosis Date   Arthritis    Heel spur    L/R   High cholesterol    Plantar fasciitis    Psoriasis    Psoriatic arthritis ([employer])    Skin cancer    Sleep apnea    Thyroid disorder    Tingling of both feet    Vertigo     Past Surgical History:  Procedure Laterality Date   ABDOMINAL HYSTERECTOMY  06/1990   BACK SURGERY  06/1992    Family History  Problem Relation Age of Onset   Heart failure Mother    Alcoholism Mother    Alcoholism Father    Bipolar disorder Sister    Scoliosis Sister    Kidney Stones Sister     Social History   Tobacco Use   Smoking status: Never   Smokeless tobacco: Never  Substance Use Topics   Alcohol use: Never   Drug use: Never    No current facility-administered medications for this encounter.  Current Outpatient Medications:    fluticasone (FLONASE) 50 MCG/ACT nasal spray, Place 2 sprays into both nostrils daily., Disp: 16 g, Rfl: 0   ibuprofen (ADVIL) 600 MG tablet, Take 1 tablet (600 mg total) by mouth every 6 (six) hours as needed., Disp: 30 tablet, Rfl: 0   ondansetron (ZOFRAN ODT) 4 MG disintegrating tablet, Take 1 tablet (4 mg total) by mouth every 8 (eight) hours as needed for nausea or vomiting., Disp: 20  tablet, Rfl: 0   Apremilast (OTEZLA) 30 MG TABS, Take 30 mg by mouth 2 (two) times daily., Disp: , Rfl:    gabapentin (NEURONTIN) 300 MG capsule, Take 3 capsules (900 mg total) by mouth at bedtime., Disp: 270 capsule, Rfl: 2   levothyroxine (SYNTHROID) 75 MCG tablet, daily., Disp: , Rfl:    omeprazole (PRILOSEC) 20 MG capsule, Take 20 mg by mouth as needed., Disp: , Rfl:    sulfaSALAzine (AZULFIDINE) 500 MG EC tablet, sulfasalazine 500 mg tablet,delayed release, Disp: , Rfl:   Allergies  Allergen Reactions   Erythromycin Nausea Only     ROS  As noted in HPI.   Physical Exam  BP (!) 164/72   Pulse 82   Temp 98.9 F (37.2 C)   Resp 20   SpO2 96%   Constitutional: Well developed, well nourished, no acute distress Eyes:  EOMI, conjunctiva normal bilaterally HENT: Normocephalic, atraumatic,mucus membranes moist.  Mild nasal congestion.  No maxillary, frontal sinus tenderness.  Normal oropharynx, no postnasal drip. Neck: No cervical lymphadenopathy, meningismus Respiratory: Normal inspiratory effort, lungs clear bilateral Cardiovascular: Normal rate regular rhythm, no murmurs rubs or gallop GI: nondistended skin: No rash, skin intact Musculoskeletal: no deformities Neurologic: Alert & oriented x 3, no focal neuro deficits Psychiatric: Speech and  behavior appropriate   ED Course   Medications  ondansetron (ZOFRAN-ODT) disintegrating tablet 4 mg (4 mg Oral Given 06/29/20 1331)    Orders Placed This Encounter  Procedures   Novel Coronavirus, NAA ([facility])    Standing Status:   Standing    Number of Occurrences:   1   Results for orders placed or performed during the hospital encounter of 06/29/20  Novel Coronavirus, NAA ([facility])   Specimen: Nasopharyngeal(NP) swabs in vial transport medium   Nasopharynge  Result Value Ref Range   SARS-CoV-2, NAA Not Detected Not Detected  SARS-COV-2, NAA 2 DAY TAT   Nasopharynge  Result Value Ref Range   SARS-CoV-2, NAA 2 DAY TAT  Performed   Basic metabolic panel  Result Value Ref Range   Glucose 91 65 - 99 mg/dL   BUN 11 8 - 27 mg/dL   Creatinine, Ser 0.54 (L) 0.57 - 1.00 mg/dL   eGFR 99 >59 mL/min/1.73   BUN/Creatinine Ratio 20 12 - 28   Sodium 136 134 - 144 mmol/L   Potassium 5.3 (H) 3.5 - 5.2 mmol/L   Chloride 98 96 - 106 mmol/L   CO2 21 20 - 29 mmol/L   Calcium 9.3 8.7 - 10.3 mg/dL     No results found for this or any previous visit (from the past 24 hour(s)). No results found.  ED Clinical Impression  1. Close exposure to COVID-19 virus   2. Nausea without vomiting      ED Assessment/Plan  Patient improved after Zofran.  We will send her home with Zofran prescription.  Supportive treatment including Tylenol/ibuprofen, Flonase, saline nasal irrigation.  Patient with likely [place].  We have 4 days to start her on an antiviral.  Will check BMP and prescribe Paxlovid if COVID is positive.  Home with Tylenol/ibuprofen, Flonase, saline nasal irrigation.  Follow-up with PMD as needed, to the ER if she gets worse.  BMP, COVID pending at the time of signing of this note.  Addendum: COVID-negative.  GFR 99.  Will advise patient to come back for retesting if she develops further symptoms and to be started on Paxlovid.  We will have staff contact her.  Discussed labs,  MDM, treatment plan, and plan for follow-up with patient. Discussed sn/sx that should prompt return to the ED. patient agrees with plan.   Meds ordered this encounter  Medications   ondansetron (ZOFRAN-ODT) disintegrating tablet 4 mg   fluticasone (FLONASE) 50 MCG/ACT nasal spray    Sig: Place 2 sprays into both nostrils daily.    Dispense:  16 g    Refill:  0   ibuprofen (ADVIL) 600 MG tablet    Sig: Take 1 tablet (600 mg total) by mouth every 6 (six) hours as needed.    Dispense:  30 tablet    Refill:  0   ondansetron (ZOFRAN ODT) 4 MG disintegrating tablet    Sig: Take 1 tablet (4 mg total) by mouth every 8 (eight) hours as needed  for nausea or vomiting.    Dispense:  20 tablet    Refill:  0      *This clinic note was created using [employer]. Therefore, there may be occasional mistakes despite careful proofreading.  ?    Melynda Ripple, MD 06/30/20 0350    Melynda Ripple, MD 07/01/20 [phone]

## 2020-06-30 LAB — NOVEL CORONAVIRUS, NAA: SARS-CoV-2, NAA: NOT DETECTED

## 2020-06-30 LAB — SARS-COV-2, NAA 2 DAY TAT

## 2020-07-01 LAB — BASIC METABOLIC PANEL
BUN/Creatinine Ratio: 20 (ref 12–28)
BUN: 11 mg/dL (ref 8–27)
CO2: 21 mmol/L (ref 20–29)
Calcium: 9.3 mg/dL (ref 8.7–10.3)
Chloride: 98 mmol/L (ref 96–106)
Creatinine, Ser: 0.54 mg/dL — ABNORMAL LOW (ref 0.57–1.00)
Glucose: 91 mg/dL (ref 65–99)
Potassium: 5.3 mmol/L — ABNORMAL HIGH (ref 3.5–5.2)
Sodium: 136 mmol/L (ref 134–144)
eGFR: 99 mL/min/{1.73_m2} (ref >59)

## 2020-07-10 ENCOUNTER — Ambulatory Visit: Payer: Medicare PPO | Admitting: Neurology

## 2020-07-30 DIAGNOSIS — M549 Dorsalgia, unspecified: Secondary | ICD-10-CM | POA: Diagnosis not present

## 2020-07-30 DIAGNOSIS — Z79899 Other long term (current) drug therapy: Secondary | ICD-10-CM | POA: Diagnosis not present

## 2020-07-30 DIAGNOSIS — L4050 Arthropathic psoriasis, unspecified: Secondary | ICD-10-CM | POA: Diagnosis not present

## 2020-07-30 DIAGNOSIS — M1990 Unspecified osteoarthritis, unspecified site: Secondary | ICD-10-CM | POA: Diagnosis not present

## 2020-07-30 DIAGNOSIS — R202 Paresthesia of skin: Secondary | ICD-10-CM | POA: Diagnosis not present

## 2020-07-30 DIAGNOSIS — M25562 Pain in left knee: Secondary | ICD-10-CM | POA: Diagnosis not present

## 2020-07-30 DIAGNOSIS — M79671 Pain in right foot: Secondary | ICD-10-CM | POA: Diagnosis not present

## 2020-07-30 DIAGNOSIS — M797 Fibromyalgia: Secondary | ICD-10-CM | POA: Diagnosis not present

## 2020-07-31 DIAGNOSIS — L4050 Arthropathic psoriasis, unspecified: Secondary | ICD-10-CM | POA: Diagnosis not present

## 2020-08-05 DIAGNOSIS — G4733 Obstructive sleep apnea (adult) (pediatric): Secondary | ICD-10-CM | POA: Diagnosis not present

## 2020-09-02 ENCOUNTER — Ambulatory Visit: Payer: Medicare PPO | Admitting: Neurology

## 2020-09-02 ENCOUNTER — Encounter: Payer: Self-pay | Admitting: Neurology

## 2020-09-02 VITALS — BP 140/80 | HR 79 | Ht 64.0 in | Wt 142.1 lb

## 2020-09-02 DIAGNOSIS — R202 Paresthesia of skin: Secondary | ICD-10-CM

## 2020-09-02 MED ORDER — GABAPENTIN 300 MG PO CAPS: 600.0000 mg | ORAL_CAPSULE | Freq: Every day | ORAL | Status: DC

## 2020-09-02 MED ORDER — PRAMIPEXOLE DIHYDROCHLORIDE 0.125 MG PO TABS: 0.1250 mg | ORAL_TABLET | Freq: Every day | ORAL | 2 refills | Status: DC

## 2020-09-02 NOTE — Progress Notes (Signed)
Reason for visit: Paresthesias, plantar fasciitis, psoriatic arthritis  Savannah Watson is an 70 y.o. female  History of present illness:  Savannah Watson is a 70 year old right-handed white female with a history of psoriatic arthritis and a prior lumbosacral spine procedure.  The patient has presented with numbness with fairly rapid onset in June 2021 involving the hands first and then spreading up to the elbows and then to the feet up to the knees.  No etiology of this numbness was ever determined.  The numbness has markedly improved but the patient still has some residual numbness in the left greater than right foot.  She has had a prior laminectomy procedure and there is evidence of some scarring involving the left S1 nerve root.  She also has been treated with physical therapy for plantar fasciitis bilaterally which seems to have helped some.  The patient has some discomfort in the legs at night, she will kick legs during the night.  She takes gabapentin 600 mg in the evening which seems to help some.  She does not take gabapentin during the day but she does have some foot discomfort during the daytime.  She denies any significant balance issues.  She did have right-sided sciatica but this also improved with physical therapy.  Prior EMG and nerve conduction study did not show evidence of a neuropathy or radiculopathy, blood work has been unremarkable.  Past Medical History:  Diagnosis Date   Arthritis    Heel spur    L/R   High cholesterol    Plantar fasciitis    Psoriasis    Psoriatic arthritis ([place])    Skin cancer    Sleep apnea    Thyroid disorder    Tingling of both feet    Vertigo     Past Surgical History:  Procedure Laterality Date   ABDOMINAL HYSTERECTOMY  06/1990   BACK SURGERY  06/1992    Family History  Problem Relation Age of Onset   Heart failure Mother    Alcoholism Mother    Alcoholism Father    Bipolar disorder Sister    Scoliosis Sister    Kidney Stones Sister      Social history:  reports that she has never smoked. She has never used smokeless tobacco. She reports that she does not drink alcohol and does not use drugs.    Allergies  Allergen Reactions   Erythromycin Nausea Only    Medications:  Prior to Admission medications   Medication Sig Start Date End Date Taking? Authorizing Provider  Apremilast (OTEZLA) 30 MG TABS Take 30 mg by mouth 2 (two) times daily.    [provider]  fluticasone (FLONASE) 50 MCG/ACT nasal spray Place 2 sprays into both nostrils daily. 06/29/20   Melynda Ripple, MD  gabapentin (NEURONTIN) 300 MG capsule Take 3 capsules (900 mg total) by mouth at bedtime. 12/26/19   Kathrynn Ducking, MD  ibuprofen (ADVIL) 600 MG tablet Take 1 tablet (600 mg total) by mouth every 6 (six) hours as needed. 06/29/20   Melynda Ripple, MD  levothyroxine (SYNTHROID) 75 MCG tablet daily.    [provider]  omeprazole (PRILOSEC) 20 MG capsule Take 20 mg by mouth as needed. 04/09/19   [provider]  ondansetron (ZOFRAN ODT) 4 MG disintegrating tablet Take 1 tablet (4 mg total) by mouth every 8 (eight) hours as needed for nausea or vomiting. 06/29/20   Melynda Ripple, MD  sulfaSALAzine (AZULFIDINE) 500 MG EC tablet sulfasalazine 500 mg  tablet,delayed release    [provider]    ROS:  Out of a complete 14 system review of symptoms, the patient complains only of the following symptoms, and all other reviewed systems are negative.  Foot discomfort, numbness Low back pain  Blood pressure 140/80, pulse 79, height '5\' 4"'$  (1.626 m), weight 142 lb 2 oz (64.5 kg).  Physical Exam  General: The patient is alert and cooperative at the time of the examination.  Skin: No significant peripheral edema is noted.   Neurologic Exam  Mental status: The patient is alert and oriented x 3 at the time of the examination. The patient has apparent normal recent and remote memory, with an apparently normal  attention span and concentration ability.   Cranial nerves: Facial symmetry is present. Speech is normal, no aphasia or dysarthria is noted. Extraocular movements are full. Visual fields are full.  Motor: The patient has good strength in all 4 extremities.  Sensory examination: Soft touch sensation is symmetric on the face, arms, and legs.  There is a slight sensory change with pinprick across the ankle on the left, up to the knee on the right.  Coordination: The patient has good finger-nose-finger and heel-to-shin bilaterally.  Gait and station: The patient has a normal gait. Tandem gait is normal. Romberg is negative. No drift is seen.  Reflexes: Deep tendon reflexes are symmetric.   MRI lumbar 01/15/20:   IMPRESSION: 1. No significant interval change in mild degenerative changes of the lumbar spine without significant foraminal or canal stenosis at any level. 2. Previously seen central disc protrusion at L3-4 has resolved. 3. Previous left laminectomy at L5-S1 with minimal perineural scarring around the posterior and posterolateral margins of the left S1 nerve root within the subarticular recess. Unchanged borderline left foraminal narrowing at this level without evidence of impingement.   Assessment/Plan:  1.  Numbness in the feet  2.  History of plantar fasciitis  3.  Possible restless leg syndrome  The initial numbness in the hands and feet has gradually improved, she now has some residual in the left greater than right foot, some of this may be related to low back issues.  Her right-sided sciatica has improved with physical therapy, she still has some residual plantar fasciitis symptoms.  She also complains of some left knee arthritic pain.  She will be given a prescription for low-dose Mirapex taking 0.125 mg at night, increase to 0.25 mg if the original dosing is not effective.  If the Mirapex does not help her symptoms in the evening, she will stop the drug and continue  the gabapentin.  She will follow-up here in 6 months, she can be followed through Dr. Krista Blue in the future.  Jill Alexanders MD 09/02/2020 3:43 PM  [facility] [address] [place], [place] [address]  Phone [phone] Fax [phone]

## 2020-09-17 DIAGNOSIS — M1712 Unilateral primary osteoarthritis, left knee: Secondary | ICD-10-CM | POA: Diagnosis not present

## 2020-09-17 DIAGNOSIS — M1711 Unilateral primary osteoarthritis, right knee: Secondary | ICD-10-CM | POA: Diagnosis not present

## 2020-10-15 DIAGNOSIS — D1801 Hemangioma of skin and subcutaneous tissue: Secondary | ICD-10-CM | POA: Diagnosis not present

## 2020-10-15 DIAGNOSIS — D2271 Melanocytic nevi of right lower limb, including hip: Secondary | ICD-10-CM | POA: Diagnosis not present

## 2020-10-15 DIAGNOSIS — L400 Psoriasis vulgaris: Secondary | ICD-10-CM | POA: Diagnosis not present

## 2020-10-15 DIAGNOSIS — Z85820 Personal history of malignant melanoma of skin: Secondary | ICD-10-CM | POA: Diagnosis not present

## 2020-10-15 DIAGNOSIS — D2262 Melanocytic nevi of left upper limb, including shoulder: Secondary | ICD-10-CM | POA: Diagnosis not present

## 2020-10-15 DIAGNOSIS — L814 Other melanin hyperpigmentation: Secondary | ICD-10-CM | POA: Diagnosis not present

## 2020-10-15 DIAGNOSIS — D225 Melanocytic nevi of trunk: Secondary | ICD-10-CM | POA: Diagnosis not present

## 2020-10-15 DIAGNOSIS — L821 Other seborrheic keratosis: Secondary | ICD-10-CM | POA: Diagnosis not present

## 2020-10-29 DIAGNOSIS — M170 Bilateral primary osteoarthritis of knee: Secondary | ICD-10-CM | POA: Diagnosis not present

## 2020-11-05 DIAGNOSIS — Z79899 Other long term (current) drug therapy: Secondary | ICD-10-CM | POA: Diagnosis not present

## 2020-11-05 DIAGNOSIS — M170 Bilateral primary osteoarthritis of knee: Secondary | ICD-10-CM | POA: Diagnosis not present

## 2020-11-05 DIAGNOSIS — M797 Fibromyalgia: Secondary | ICD-10-CM | POA: Diagnosis not present

## 2020-11-05 DIAGNOSIS — G4733 Obstructive sleep apnea (adult) (pediatric): Secondary | ICD-10-CM | POA: Diagnosis not present

## 2020-11-05 DIAGNOSIS — M1990 Unspecified osteoarthritis, unspecified site: Secondary | ICD-10-CM | POA: Diagnosis not present

## 2020-11-05 DIAGNOSIS — M79671 Pain in right foot: Secondary | ICD-10-CM | POA: Diagnosis not present

## 2020-11-05 DIAGNOSIS — L4050 Arthropathic psoriasis, unspecified: Secondary | ICD-10-CM | POA: Diagnosis not present

## 2020-11-05 DIAGNOSIS — M25562 Pain in left knee: Secondary | ICD-10-CM | POA: Diagnosis not present

## 2020-11-05 DIAGNOSIS — M549 Dorsalgia, unspecified: Secondary | ICD-10-CM | POA: Diagnosis not present

## 2020-11-05 DIAGNOSIS — Z23 Encounter for immunization: Secondary | ICD-10-CM | POA: Diagnosis not present

## 2020-11-13 DIAGNOSIS — M170 Bilateral primary osteoarthritis of knee: Secondary | ICD-10-CM | POA: Diagnosis not present

## 2020-12-15 DIAGNOSIS — Z79899 Other long term (current) drug therapy: Secondary | ICD-10-CM | POA: Diagnosis not present

## 2020-12-24 DIAGNOSIS — H43812 Vitreous degeneration, left eye: Secondary | ICD-10-CM | POA: Diagnosis not present

## 2020-12-24 DIAGNOSIS — H25813 Combined forms of age-related cataract, bilateral: Secondary | ICD-10-CM | POA: Diagnosis not present

## 2020-12-24 DIAGNOSIS — H43391 Other vitreous opacities, right eye: Secondary | ICD-10-CM | POA: Diagnosis not present

## 2021-02-09 DIAGNOSIS — L4050 Arthropathic psoriasis, unspecified: Secondary | ICD-10-CM | POA: Diagnosis not present

## 2021-02-09 DIAGNOSIS — M79604 Pain in right leg: Secondary | ICD-10-CM | POA: Diagnosis not present

## 2021-02-09 DIAGNOSIS — M1990 Unspecified osteoarthritis, unspecified site: Secondary | ICD-10-CM | POA: Diagnosis not present

## 2021-02-09 DIAGNOSIS — M549 Dorsalgia, unspecified: Secondary | ICD-10-CM | POA: Diagnosis not present

## 2021-02-09 DIAGNOSIS — M79671 Pain in right foot: Secondary | ICD-10-CM | POA: Diagnosis not present

## 2021-02-09 DIAGNOSIS — M797 Fibromyalgia: Secondary | ICD-10-CM | POA: Diagnosis not present

## 2021-02-09 DIAGNOSIS — Z79899 Other long term (current) drug therapy: Secondary | ICD-10-CM | POA: Diagnosis not present

## 2021-03-03 ENCOUNTER — Ambulatory Visit: Payer: Medicare PPO | Admitting: Neurology

## 2021-03-11 ENCOUNTER — Other Ambulatory Visit (HOSPITAL_COMMUNITY): Payer: Self-pay | Admitting: Family Medicine

## 2021-03-11 DIAGNOSIS — Z1231 Encounter for screening mammogram for malignant neoplasm of breast: Secondary | ICD-10-CM

## 2021-04-01 ENCOUNTER — Ambulatory Visit (HOSPITAL_COMMUNITY): Payer: Medicare PPO

## 2021-04-13 DIAGNOSIS — M79671 Pain in right foot: Secondary | ICD-10-CM | POA: Diagnosis not present

## 2021-04-13 DIAGNOSIS — Z79899 Other long term (current) drug therapy: Secondary | ICD-10-CM | POA: Diagnosis not present

## 2021-04-13 DIAGNOSIS — M1990 Unspecified osteoarthritis, unspecified site: Secondary | ICD-10-CM | POA: Diagnosis not present

## 2021-04-13 DIAGNOSIS — M79604 Pain in right leg: Secondary | ICD-10-CM | POA: Diagnosis not present

## 2021-04-13 DIAGNOSIS — L4050 Arthropathic psoriasis, unspecified: Secondary | ICD-10-CM | POA: Diagnosis not present

## 2021-04-13 DIAGNOSIS — M549 Dorsalgia, unspecified: Secondary | ICD-10-CM | POA: Diagnosis not present

## 2021-04-13 DIAGNOSIS — M797 Fibromyalgia: Secondary | ICD-10-CM | POA: Diagnosis not present

## 2021-04-25 ENCOUNTER — Ambulatory Visit: Payer: Self-pay

## 2021-04-30 ENCOUNTER — Ambulatory Visit: Payer: Medicare PPO | Admitting: Neurology

## 2021-04-30 ENCOUNTER — Encounter: Payer: Self-pay | Admitting: Neurology

## 2021-04-30 VITALS — BP 160/80 | HR 65 | Ht 64.0 in | Wt 147.0 lb

## 2021-04-30 DIAGNOSIS — L4050 Arthropathic psoriasis, unspecified: Secondary | ICD-10-CM

## 2021-04-30 DIAGNOSIS — R52 Pain, unspecified: Secondary | ICD-10-CM | POA: Insufficient documentation

## 2021-04-30 MED ORDER — DULOXETINE HCL 30 MG PO CPEP: 30.0000 mg | ORAL_CAPSULE | Freq: Every day | ORAL | 11 refills | Status: DC

## 2021-04-30 NOTE — Progress Notes (Signed)
? ?Chief Complaint  ?Patient presents with  ? Follow-up  ?  Room 12 - alone. Restlessness at night is still problematic. Mirapex was unhelpful. Continues to take gabapentin 350m, 2 capsules at bedtime.   ? ?ASSESSMENT AND PLAN ? ?FSAN RUAis a 71y.o. female   ?Lower extremity achy pain ?Excessive movement during sleep ?Obstructive sleep apnea ? Normal neurological examinations ? Laboratory evaluation was essentially normal, in specific normal CPK ? Normal EMG nerve conduction study ? Failed to respond to dopamine agonist Mirapex, suboptimal control with gabapentin up to 900 mg every night ? Will try Cymbalta 30 mg daily, may continue titrating up the dosage if it is helpful ? History are not typical for restless leg symptoms, she is not only symptomatic while resting, the main concern is her diffuse body achy pain throughout the day that is preventing her from going to sleep, this is also complicated by her obstructive sleep apnea ?  ?DIAGNOSTIC DATA (LABS, IMAGING, TESTING) ?- I reviewed patient records, labs, notes, testing and imaging myself where available. ? ? ?MEDICAL HISTORY: ? ?FLouanne Belton seen in request by   GSharilyn Sites MD ?[address]?[place] [address] GSharilyn Sites MD  ? ?I reviewed and summarized the referring note. PMHx. ?Hypothyrodism ?GERD ?HLD ?Psoriatic arthritis,  ?Lumbar decompression in 1980s,  ?Obstructive sleep apnea, on CPAP ? ?Patient had psoriasis psoriatic arthritis for more than 40 years, had multiple joints pain, since 2020, she also experienced bilateral lower extremity deep achy pain from waist down, constant throughout the day 4 out of 10, but with exertion sometimes and up to 8 out of 10, she has pain constantly throughout the day, at nighttime, it is difficult for her to find a comfortable position to go to sleep ? ?She was treated with gabapentin, titrating dose up to 900 mg with moderate help, but caused dry muscles.  Recently she was started on  tramadol 50 mg every night, which has helped her ? ?She was evaluated by Dr. WJannifer Franklinin 2021, EMG nerve conduction study was normal, ?Laboratory evaluation was normal including ESR, C-reactive protein, CPK, rheumatoid factor, ANA, Lyme titer, CPK, B12, RPR, angiotensin-converting enzyme, protein electrophoresis, ? ?She was empirically treated with Mirapex without helping, has stopped taking it now. ?  ? ?PHYSICAL EXAM: ?  ?Vitals:  ? 04/30/21 0717  ?BP: (!) 160/80  ?Pulse: 65  ?Weight: 147 lb (66.7 kg)  ?Height: _0  (1.626 m)  ? ?Not recorded ?  ? ? ?Body mass index is 25.23 kg/m?. ? ?PHYSICAL EXAMNIATION: ? ?Gen: NAD, conversant, well nourised, well groomed                     ?Cardiovascular: Regular rate rhythm, no peripheral edema, warm, nontender. ?Eyes: Conjunctivae clear without exudates or hemorrhage ?Neck: Supple, no carotid bruits. ?Pulmonary: Clear to auscultation bilaterally  ? ?NEUROLOGICAL EXAM: ? ?MENTAL STATUS: ?Speech/cognition: ?Awake, alert, oriented to history taking care of conversation ? ?CRANIAL NERVES: ?CN II: Visual fields are full to confrontation. Pupils are round equal and briskly reactive to light. ?CN III, IV, VI: extraocular movement are normal. No ptosis. ?CN V: Facial sensation is intact to light touch ?CN VII: Face is symmetric with normal eye closure  ?CN VIII: Hearing is normal to causal conversation. ?CN IX, X: Phonation is normal. ?CN XI: Head turning and shoulder shrug are intact ? ?MOTOR: ?There is no pronator drift of out-stretched arms. Muscle bulk and tone are normal. Muscle  strength is normal. ? ?REFLEXES: ?Reflexes are 1 and symmetric  ? ?SENSORY: ?Intact to light touch, pinprick and vibratory sensation are intact in fingers and toes. ? ?COORDINATION: ?There is no trunk or limb dysmetria noted. ? ?GAIT/STANCE: Able to get up from seated position arm crossed ? ?REVIEW OF SYSTEMS:  ?Full 14 system review of systems performed and notable only for as above ?All other review  of systems were negative. ? ? ?ALLERGIES: ?Allergies  ?Allergen Reactions  ? Erythromycin Nausea Only  ? ? ?HOME MEDICATIONS: ?Current Outpatient Medications  ?Medication Sig Dispense Refill  ? Apremilast (OTEZLA) 30 MG TABS Take 30 mg by mouth 2 (two) times daily.    ? folic acid (FOLVITE) 1 MG tablet Take 1 mg by mouth daily.    ? gabapentin (NEURONTIN) 300 MG capsule Take 2 capsules (600 mg total) by mouth at bedtime.    ? ibuprofen (ADVIL) 600 MG tablet Take 1 tablet (600 mg total) by mouth every 6 (six) hours as needed. 30 tablet 0  ? levothyroxine (SYNTHROID) 75 MCG tablet daily.    ? methotrexate (RHEUMATREX) 2.5 MG tablet Take 10 mg by mouth once a week.    ? omeprazole (PRILOSEC) 20 MG capsule Take 20 mg by mouth as needed.    ? rosuvastatin (CRESTOR) 5 MG tablet Take 5 mg by mouth daily.    ? sulfaSALAzine (AZULFIDINE) 500 MG EC tablet 1,500 mg.    ? traMADol (ULTRAM) 50 MG tablet Take 50 mg by mouth every evening.    ? ?No current facility-administered medications for this visit.  ? ? ?PAST MEDICAL HISTORY: ?Past Medical History:  ?Diagnosis Date  ? Arthritis   ? Heel spur   ? L/R  ? High cholesterol   ? Plantar fasciitis   ? Psoriasis   ? Psoriatic arthritis ([place])   ? Skin cancer   ? Sleep apnea   ? Thyroid disorder   ? Tingling of both feet   ? Vertigo   ? ? ?PAST SURGICAL HISTORY: ?Past Surgical History:  ?Procedure Laterality Date  ? ABDOMINAL HYSTERECTOMY  06/1990  ? BACK SURGERY  06/1992  ? ? ?FAMILY HISTORY: ?Family History  ?Problem Relation Age of Onset  ? Heart failure Mother   ? Alcoholism Mother   ? Alcoholism Father   ? Bipolar disorder Sister   ? Scoliosis Sister   ? Kidney Stones Sister   ? ? ?SOCIAL HISTORY: ?Social History  ? ?Socioeconomic History  ? Marital status: Married  ?  Spouse name: Sonia Side  ? Number of children: 2  ? Years of education: Not on file  ? Highest education level: Bachelor's degree (e.g., BA, AB, BS)  ?Occupational History  ?  Comment: retired  ?Tobacco Use  ? Smoking  status: Never  ? Smokeless tobacco: Never  ?Substance and Sexual Activity  ? Alcohol use: Never  ? Drug use: Never  ? Sexual activity: Not on file  ?Other Topics Concern  ? Not on file  ?Social History Narrative  ? Lives with spouse  ? Caffeine 2-3 c daily  ? ?Social Determinants of Health  ? ?Financial Resource Strain: Not on file  ?Food Insecurity: Not on file  ?Transportation Needs: Not on file  ?Physical Activity: Not on file  ?Stress: Not on file  ?Social Connections: Not on file  ?Intimate Partner Violence: Not on file  ? ? ? ? ?Marcial Pacas, M.D. Ph.D. ? ?[facility] ?[address] ?[place], [place] [address] ?Ph: [phone] ?Fax: [phone] ? ?CC:  Sharilyn Sites, MD ?[address] ?[place],  [place] [address]  Sharilyn Sites, MD   ?

## 2021-05-11 ENCOUNTER — Ambulatory Visit (HOSPITAL_COMMUNITY)
Admission: RE | Admit: 2021-05-11 | Discharge: 2021-05-11 | Disposition: A | Discharge status: Home / Self Care | Payer: Medicare PPO | Source: Ambulatory Visit | Attending: Family Medicine | Admitting: Family Medicine

## 2021-05-11 DIAGNOSIS — M79604 Pain in right leg: Secondary | ICD-10-CM | POA: Diagnosis not present

## 2021-05-11 DIAGNOSIS — Z79899 Other long term (current) drug therapy: Secondary | ICD-10-CM | POA: Diagnosis not present

## 2021-05-11 DIAGNOSIS — M549 Dorsalgia, unspecified: Secondary | ICD-10-CM | POA: Diagnosis not present

## 2021-05-11 DIAGNOSIS — M1990 Unspecified osteoarthritis, unspecified site: Secondary | ICD-10-CM | POA: Diagnosis not present

## 2021-05-11 DIAGNOSIS — L4050 Arthropathic psoriasis, unspecified: Secondary | ICD-10-CM | POA: Diagnosis not present

## 2021-05-11 DIAGNOSIS — M797 Fibromyalgia: Secondary | ICD-10-CM | POA: Diagnosis not present

## 2021-05-11 DIAGNOSIS — Z1231 Encounter for screening mammogram for malignant neoplasm of breast: Secondary | ICD-10-CM | POA: Insufficient documentation

## 2021-05-11 DIAGNOSIS — M79671 Pain in right foot: Secondary | ICD-10-CM | POA: Diagnosis not present

## 2021-06-10 DIAGNOSIS — M9903 Segmental and somatic dysfunction of lumbar region: Secondary | ICD-10-CM | POA: Diagnosis not present

## 2021-06-10 DIAGNOSIS — M9901 Segmental and somatic dysfunction of cervical region: Secondary | ICD-10-CM | POA: Diagnosis not present

## 2021-06-10 DIAGNOSIS — M9902 Segmental and somatic dysfunction of thoracic region: Secondary | ICD-10-CM | POA: Diagnosis not present

## 2021-06-10 DIAGNOSIS — M5416 Radiculopathy, lumbar region: Secondary | ICD-10-CM | POA: Diagnosis not present

## 2021-06-15 DIAGNOSIS — M9901 Segmental and somatic dysfunction of cervical region: Secondary | ICD-10-CM | POA: Diagnosis not present

## 2021-06-15 DIAGNOSIS — M9903 Segmental and somatic dysfunction of lumbar region: Secondary | ICD-10-CM | POA: Diagnosis not present

## 2021-06-15 DIAGNOSIS — M5416 Radiculopathy, lumbar region: Secondary | ICD-10-CM | POA: Diagnosis not present

## 2021-06-15 DIAGNOSIS — M9902 Segmental and somatic dysfunction of thoracic region: Secondary | ICD-10-CM | POA: Diagnosis not present

## 2021-06-22 DIAGNOSIS — M5416 Radiculopathy, lumbar region: Secondary | ICD-10-CM | POA: Diagnosis not present

## 2021-06-22 DIAGNOSIS — M9903 Segmental and somatic dysfunction of lumbar region: Secondary | ICD-10-CM | POA: Diagnosis not present

## 2021-06-22 DIAGNOSIS — M9901 Segmental and somatic dysfunction of cervical region: Secondary | ICD-10-CM | POA: Diagnosis not present

## 2021-06-22 DIAGNOSIS — M9902 Segmental and somatic dysfunction of thoracic region: Secondary | ICD-10-CM | POA: Diagnosis not present

## 2021-06-24 DIAGNOSIS — M9901 Segmental and somatic dysfunction of cervical region: Secondary | ICD-10-CM | POA: Diagnosis not present

## 2021-06-24 DIAGNOSIS — M9902 Segmental and somatic dysfunction of thoracic region: Secondary | ICD-10-CM | POA: Diagnosis not present

## 2021-06-24 DIAGNOSIS — M5416 Radiculopathy, lumbar region: Secondary | ICD-10-CM | POA: Diagnosis not present

## 2021-06-24 DIAGNOSIS — M9903 Segmental and somatic dysfunction of lumbar region: Secondary | ICD-10-CM | POA: Diagnosis not present

## 2021-07-03 DIAGNOSIS — M9902 Segmental and somatic dysfunction of thoracic region: Secondary | ICD-10-CM | POA: Diagnosis not present

## 2021-07-03 DIAGNOSIS — M9901 Segmental and somatic dysfunction of cervical region: Secondary | ICD-10-CM | POA: Diagnosis not present

## 2021-07-03 DIAGNOSIS — M5416 Radiculopathy, lumbar region: Secondary | ICD-10-CM | POA: Diagnosis not present

## 2021-07-03 DIAGNOSIS — M9903 Segmental and somatic dysfunction of lumbar region: Secondary | ICD-10-CM | POA: Diagnosis not present

## 2021-07-06 DIAGNOSIS — M5416 Radiculopathy, lumbar region: Secondary | ICD-10-CM | POA: Diagnosis not present

## 2021-07-06 DIAGNOSIS — M9903 Segmental and somatic dysfunction of lumbar region: Secondary | ICD-10-CM | POA: Diagnosis not present

## 2021-07-06 DIAGNOSIS — M9901 Segmental and somatic dysfunction of cervical region: Secondary | ICD-10-CM | POA: Diagnosis not present

## 2021-07-06 DIAGNOSIS — M9902 Segmental and somatic dysfunction of thoracic region: Secondary | ICD-10-CM | POA: Diagnosis not present

## 2021-07-10 DIAGNOSIS — M9901 Segmental and somatic dysfunction of cervical region: Secondary | ICD-10-CM | POA: Diagnosis not present

## 2021-07-10 DIAGNOSIS — M9903 Segmental and somatic dysfunction of lumbar region: Secondary | ICD-10-CM | POA: Diagnosis not present

## 2021-07-10 DIAGNOSIS — M5416 Radiculopathy, lumbar region: Secondary | ICD-10-CM | POA: Diagnosis not present

## 2021-07-10 DIAGNOSIS — M9902 Segmental and somatic dysfunction of thoracic region: Secondary | ICD-10-CM | POA: Diagnosis not present

## 2021-07-13 DIAGNOSIS — M170 Bilateral primary osteoarthritis of knee: Secondary | ICD-10-CM | POA: Diagnosis not present

## 2021-07-14 DIAGNOSIS — Z6823 Body mass index (BMI) 23.0-23.9, adult: Secondary | ICD-10-CM | POA: Diagnosis not present

## 2021-07-14 DIAGNOSIS — M5416 Radiculopathy, lumbar region: Secondary | ICD-10-CM | POA: Diagnosis not present

## 2021-07-14 DIAGNOSIS — R7309 Other abnormal glucose: Secondary | ICD-10-CM | POA: Diagnosis not present

## 2021-07-14 DIAGNOSIS — E039 Hypothyroidism, unspecified: Secondary | ICD-10-CM | POA: Diagnosis not present

## 2021-07-14 DIAGNOSIS — M9902 Segmental and somatic dysfunction of thoracic region: Secondary | ICD-10-CM | POA: Diagnosis not present

## 2021-07-14 DIAGNOSIS — E559 Vitamin D deficiency, unspecified: Secondary | ICD-10-CM | POA: Diagnosis not present

## 2021-07-14 DIAGNOSIS — G4733 Obstructive sleep apnea (adult) (pediatric): Secondary | ICD-10-CM | POA: Diagnosis not present

## 2021-07-14 DIAGNOSIS — E7849 Other hyperlipidemia: Secondary | ICD-10-CM | POA: Diagnosis not present

## 2021-07-14 DIAGNOSIS — Z1331 Encounter for screening for depression: Secondary | ICD-10-CM | POA: Diagnosis not present

## 2021-07-14 DIAGNOSIS — M9903 Segmental and somatic dysfunction of lumbar region: Secondary | ICD-10-CM | POA: Diagnosis not present

## 2021-07-14 DIAGNOSIS — M9901 Segmental and somatic dysfunction of cervical region: Secondary | ICD-10-CM | POA: Diagnosis not present

## 2021-07-14 DIAGNOSIS — Z0001 Encounter for general adult medical examination with abnormal findings: Secondary | ICD-10-CM | POA: Diagnosis not present

## 2021-07-14 DIAGNOSIS — L4050 Arthropathic psoriasis, unspecified: Secondary | ICD-10-CM | POA: Diagnosis not present

## 2021-07-14 DIAGNOSIS — E782 Mixed hyperlipidemia: Secondary | ICD-10-CM | POA: Diagnosis not present

## 2021-07-21 DIAGNOSIS — M170 Bilateral primary osteoarthritis of knee: Secondary | ICD-10-CM | POA: Diagnosis not present

## 2021-07-30 DIAGNOSIS — M170 Bilateral primary osteoarthritis of knee: Secondary | ICD-10-CM | POA: Diagnosis not present

## 2021-09-15 DIAGNOSIS — M549 Dorsalgia, unspecified: Secondary | ICD-10-CM | POA: Diagnosis not present

## 2021-09-15 DIAGNOSIS — M1990 Unspecified osteoarthritis, unspecified site: Secondary | ICD-10-CM | POA: Diagnosis not present

## 2021-09-15 DIAGNOSIS — M797 Fibromyalgia: Secondary | ICD-10-CM | POA: Diagnosis not present

## 2021-09-15 DIAGNOSIS — L4050 Arthropathic psoriasis, unspecified: Secondary | ICD-10-CM | POA: Diagnosis not present

## 2021-09-15 DIAGNOSIS — M79671 Pain in right foot: Secondary | ICD-10-CM | POA: Diagnosis not present

## 2021-09-15 DIAGNOSIS — M79604 Pain in right leg: Secondary | ICD-10-CM | POA: Diagnosis not present

## 2021-09-15 DIAGNOSIS — Z79899 Other long term (current) drug therapy: Secondary | ICD-10-CM | POA: Diagnosis not present

## 2021-10-15 DIAGNOSIS — Z6825 Body mass index (BMI) 25.0-25.9, adult: Secondary | ICD-10-CM | POA: Diagnosis not present

## 2021-10-15 DIAGNOSIS — R7309 Other abnormal glucose: Secondary | ICD-10-CM | POA: Diagnosis not present

## 2021-10-15 DIAGNOSIS — E663 Overweight: Secondary | ICD-10-CM | POA: Diagnosis not present

## 2021-10-15 DIAGNOSIS — E039 Hypothyroidism, unspecified: Secondary | ICD-10-CM | POA: Diagnosis not present

## 2021-10-15 DIAGNOSIS — S41101A Unspecified open wound of right upper arm, initial encounter: Secondary | ICD-10-CM | POA: Diagnosis not present

## 2021-10-15 DIAGNOSIS — E782 Mixed hyperlipidemia: Secondary | ICD-10-CM | POA: Diagnosis not present

## 2021-10-15 DIAGNOSIS — E559 Vitamin D deficiency, unspecified: Secondary | ICD-10-CM | POA: Diagnosis not present

## 2021-10-15 DIAGNOSIS — L4050 Arthropathic psoriasis, unspecified: Secondary | ICD-10-CM | POA: Diagnosis not present

## 2021-11-09 DIAGNOSIS — Z85820 Personal history of malignant melanoma of skin: Secondary | ICD-10-CM | POA: Diagnosis not present

## 2021-11-09 DIAGNOSIS — L908 Other atrophic disorders of skin: Secondary | ICD-10-CM | POA: Diagnosis not present

## 2021-11-09 DIAGNOSIS — Z1283 Encounter for screening for malignant neoplasm of skin: Secondary | ICD-10-CM | POA: Diagnosis not present

## 2021-11-09 DIAGNOSIS — Z08 Encounter for follow-up examination after completed treatment for malignant neoplasm: Secondary | ICD-10-CM | POA: Diagnosis not present

## 2021-11-09 DIAGNOSIS — D485 Neoplasm of uncertain behavior of skin: Secondary | ICD-10-CM | POA: Diagnosis not present

## 2022-01-06 DIAGNOSIS — H25811 Combined forms of age-related cataract, right eye: Secondary | ICD-10-CM | POA: Diagnosis not present

## 2022-01-06 DIAGNOSIS — H43391 Other vitreous opacities, right eye: Secondary | ICD-10-CM | POA: Diagnosis not present

## 2022-01-06 DIAGNOSIS — H43812 Vitreous degeneration, left eye: Secondary | ICD-10-CM | POA: Diagnosis not present

## 2022-01-26 DIAGNOSIS — M797 Fibromyalgia: Secondary | ICD-10-CM | POA: Diagnosis not present

## 2022-01-26 DIAGNOSIS — Z79899 Other long term (current) drug therapy: Secondary | ICD-10-CM | POA: Diagnosis not present

## 2022-01-26 DIAGNOSIS — M1990 Unspecified osteoarthritis, unspecified site: Secondary | ICD-10-CM | POA: Diagnosis not present

## 2022-01-26 DIAGNOSIS — M79604 Pain in right leg: Secondary | ICD-10-CM | POA: Diagnosis not present

## 2022-01-26 DIAGNOSIS — M549 Dorsalgia, unspecified: Secondary | ICD-10-CM | POA: Diagnosis not present

## 2022-01-26 DIAGNOSIS — M79671 Pain in right foot: Secondary | ICD-10-CM | POA: Diagnosis not present

## 2022-01-26 DIAGNOSIS — L4050 Arthropathic psoriasis, unspecified: Secondary | ICD-10-CM | POA: Diagnosis not present

## 2022-01-28 DIAGNOSIS — H2511 Age-related nuclear cataract, right eye: Secondary | ICD-10-CM | POA: Diagnosis not present

## 2022-02-07 DIAGNOSIS — H2512 Age-related nuclear cataract, left eye: Secondary | ICD-10-CM | POA: Diagnosis not present

## 2022-02-11 DIAGNOSIS — H2512 Age-related nuclear cataract, left eye: Secondary | ICD-10-CM | POA: Diagnosis not present

## 2022-04-15 DIAGNOSIS — E663 Overweight: Secondary | ICD-10-CM | POA: Diagnosis not present

## 2022-04-15 DIAGNOSIS — L4050 Arthropathic psoriasis, unspecified: Secondary | ICD-10-CM | POA: Diagnosis not present

## 2022-04-15 DIAGNOSIS — E782 Mixed hyperlipidemia: Secondary | ICD-10-CM | POA: Diagnosis not present

## 2022-04-15 DIAGNOSIS — Z6826 Body mass index (BMI) 26.0-26.9, adult: Secondary | ICD-10-CM | POA: Diagnosis not present

## 2022-05-28 DIAGNOSIS — L4050 Arthropathic psoriasis, unspecified: Secondary | ICD-10-CM | POA: Diagnosis not present

## 2022-05-28 DIAGNOSIS — M797 Fibromyalgia: Secondary | ICD-10-CM | POA: Diagnosis not present

## 2022-05-28 DIAGNOSIS — M79671 Pain in right foot: Secondary | ICD-10-CM | POA: Diagnosis not present

## 2022-05-28 DIAGNOSIS — M549 Dorsalgia, unspecified: Secondary | ICD-10-CM | POA: Diagnosis not present

## 2022-05-28 DIAGNOSIS — M1990 Unspecified osteoarthritis, unspecified site: Secondary | ICD-10-CM | POA: Diagnosis not present

## 2022-05-28 DIAGNOSIS — Z79899 Other long term (current) drug therapy: Secondary | ICD-10-CM | POA: Diagnosis not present

## 2022-06-25 DIAGNOSIS — Z6826 Body mass index (BMI) 26.0-26.9, adult: Secondary | ICD-10-CM | POA: Diagnosis not present

## 2022-06-25 DIAGNOSIS — E039 Hypothyroidism, unspecified: Secondary | ICD-10-CM | POA: Diagnosis not present

## 2022-06-25 DIAGNOSIS — E7849 Other hyperlipidemia: Secondary | ICD-10-CM | POA: Diagnosis not present

## 2022-06-25 DIAGNOSIS — E559 Vitamin D deficiency, unspecified: Secondary | ICD-10-CM | POA: Diagnosis not present

## 2022-06-25 DIAGNOSIS — R7309 Other abnormal glucose: Secondary | ICD-10-CM | POA: Diagnosis not present

## 2022-06-27 IMAGING — MG MM DIGITAL SCREENING BILAT W/ TOMO AND CAD
8 series · 8 of 24 positions shown · non-contrast
Comparison: Previous exam(s).

CLINICAL DATA: Screening.

EXAM:
DIGITAL SCREENING BILATERAL MAMMOGRAM WITH TOMOSYNTHESIS AND CAD
TECHNIQUE: Bilateral screening digital craniocaudal and mediolateral oblique
mammograms were obtained. Bilateral screening digital breast
tomosynthesis was performed. The images were evaluated with
computer-aided detection.

[R CC synth-2D]
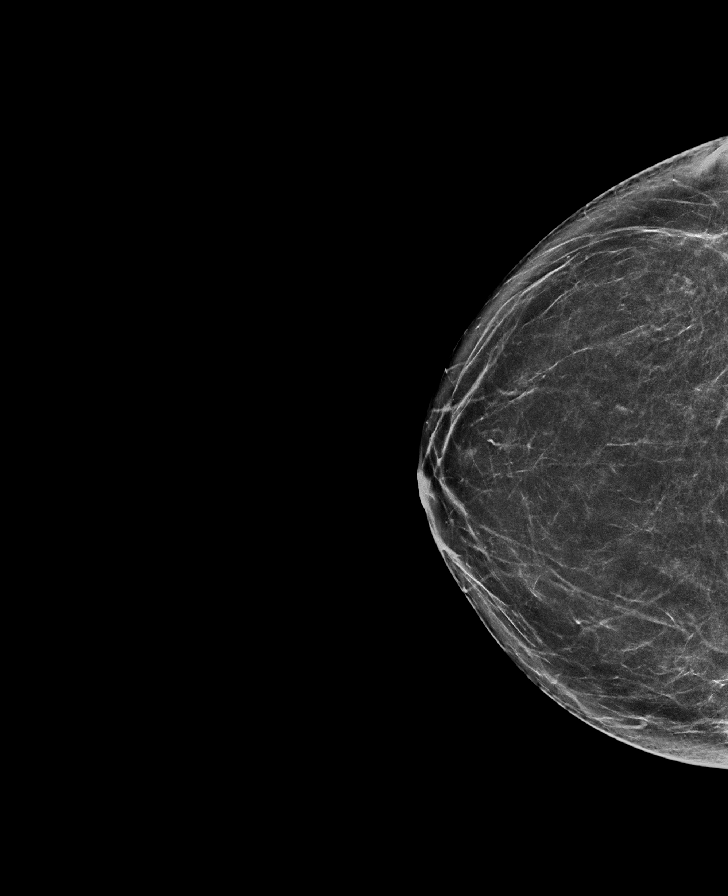

[L CC synth-2D]
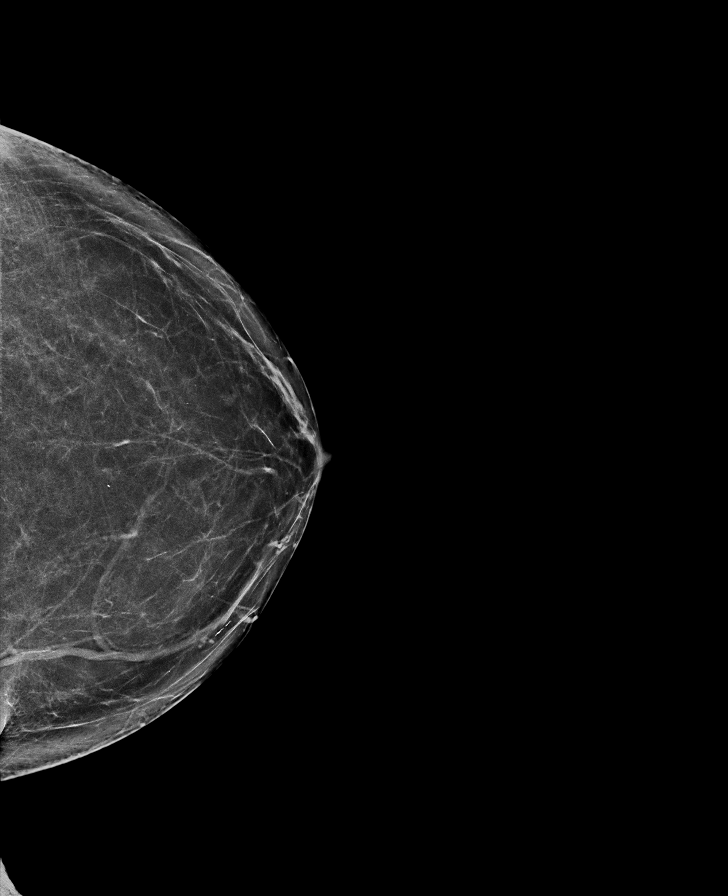

[L MLO synth-2D]
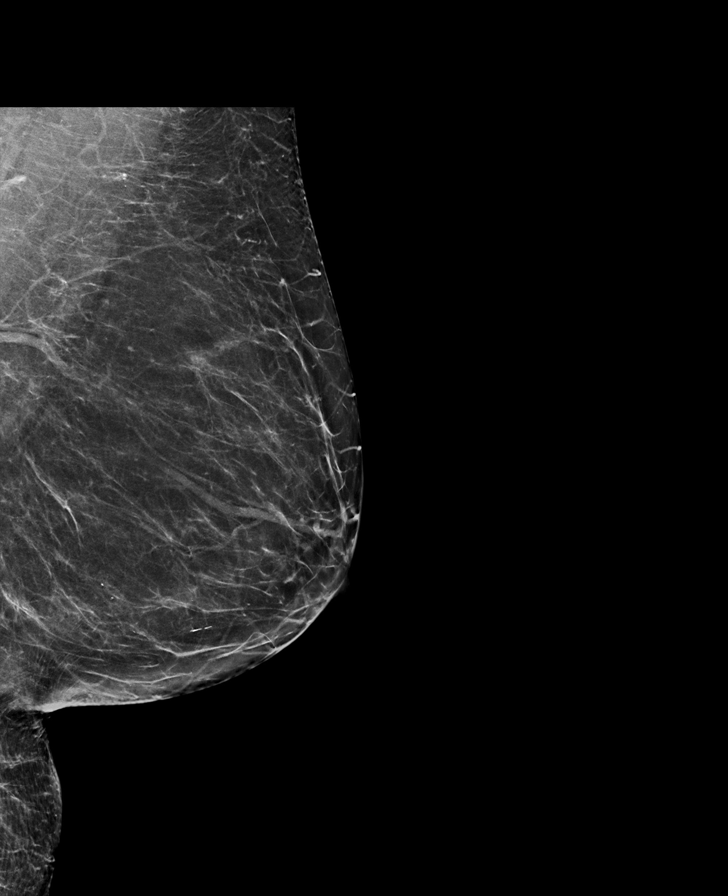

[R MLO synth-2D]
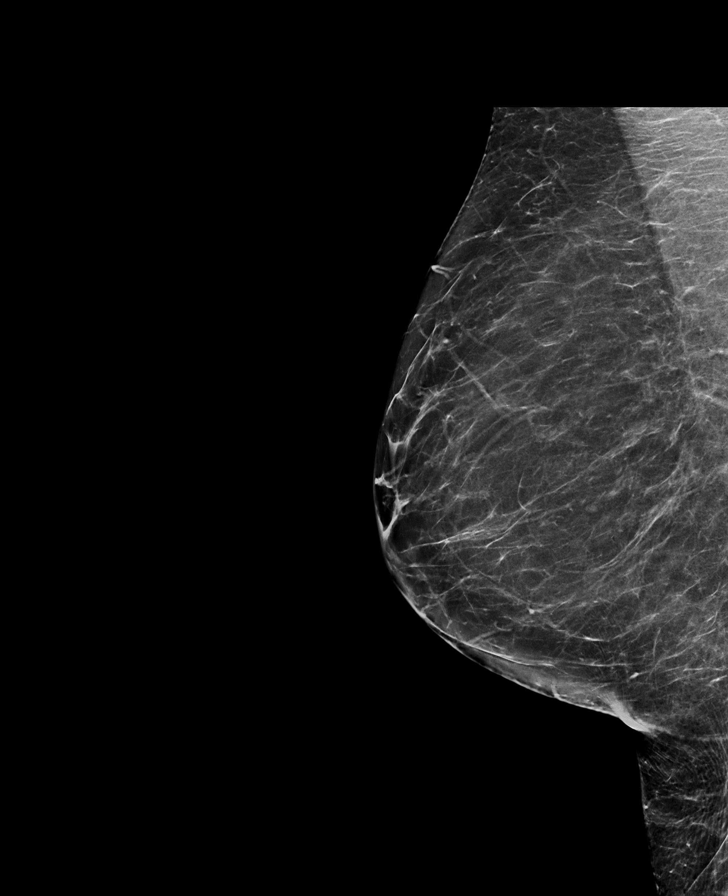

[L CC tomo · tomo slice 37/72.0]
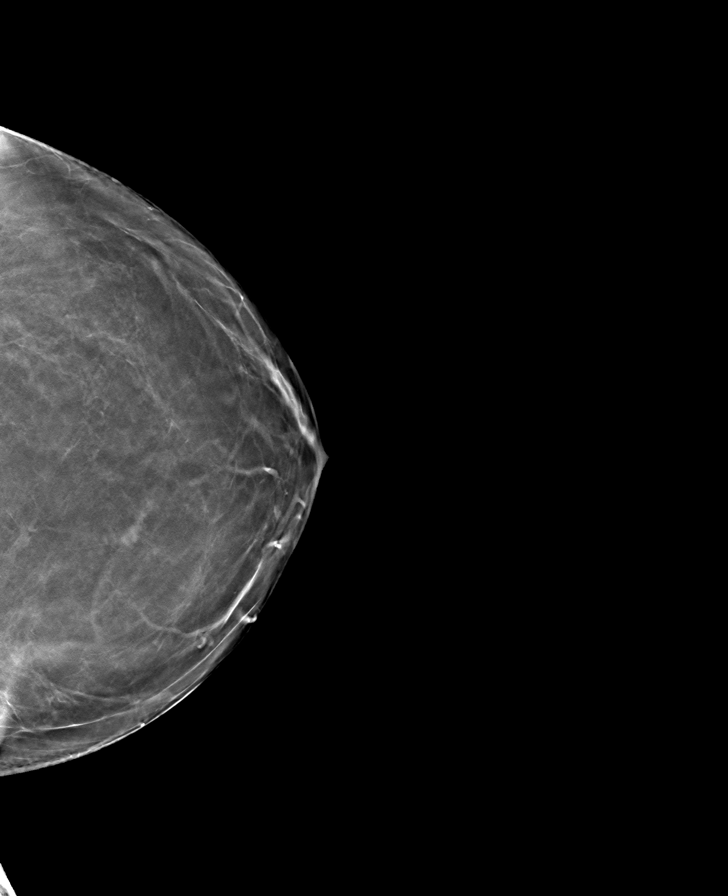

[L MLO tomo · tomo slice 37/72.0]
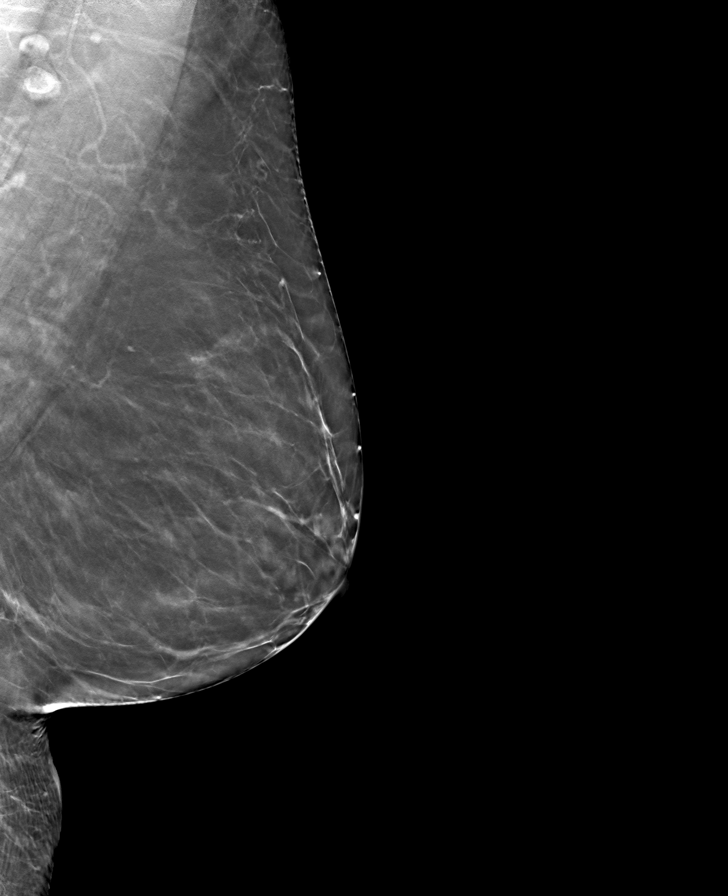

[R CC tomo · tomo slice 35/69.0]
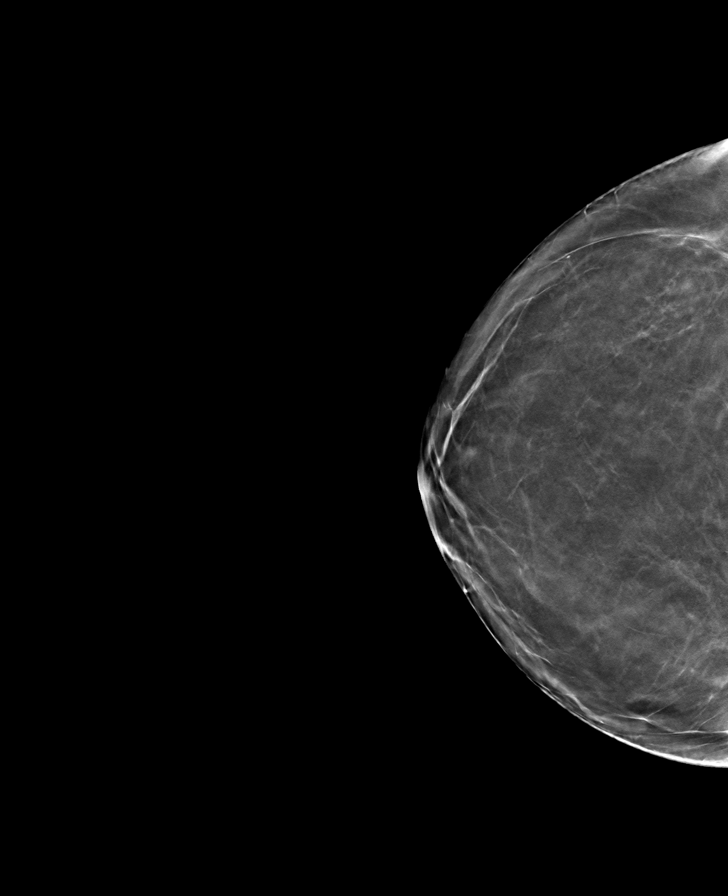

[R MLO tomo · tomo slice 37/73.0]
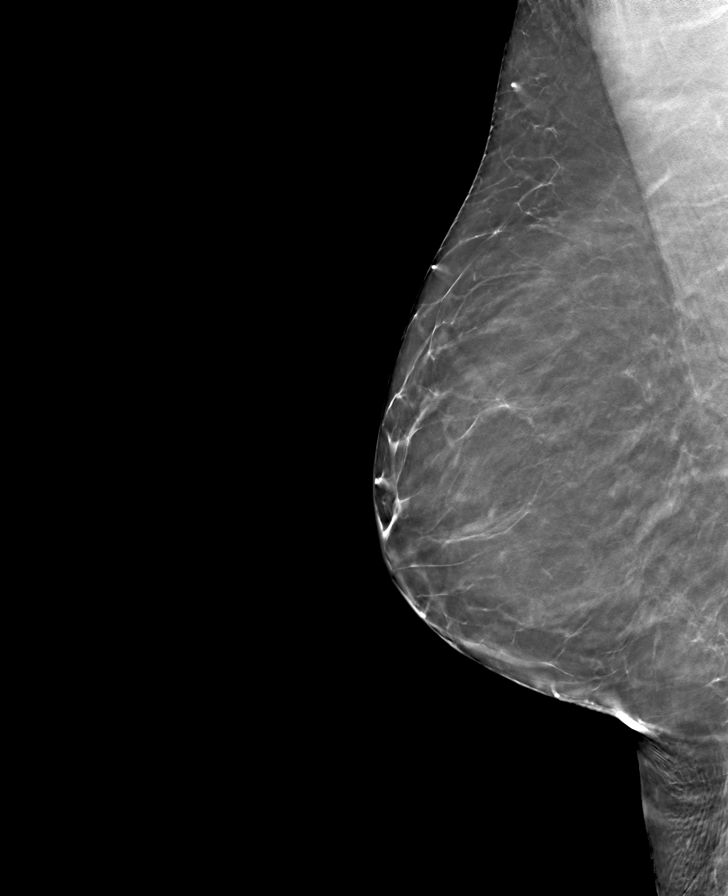

[8 of 24 positions shown; findings below may reference images not displayed]

ACR Breast Density Category b: There are scattered areas of
fibroglandular density.
FINDINGS: There are no findings suspicious for malignancy.
IMPRESSION: No mammographic evidence of malignancy. A result letter of this
screening mammogram will be mailed directly to the patient.

RECOMMENDATION:
Screening mammogram in one year. (Code:51-O-LD2)

BI-RADS CATEGORY  1: Negative.

## 2022-07-16 ENCOUNTER — Other Ambulatory Visit (HOSPITAL_COMMUNITY): Payer: Self-pay | Admitting: Family Medicine

## 2022-07-16 DIAGNOSIS — E782 Mixed hyperlipidemia: Secondary | ICD-10-CM | POA: Diagnosis not present

## 2022-07-16 DIAGNOSIS — M25541 Pain in joints of right hand: Secondary | ICD-10-CM

## 2022-07-16 DIAGNOSIS — E559 Vitamin D deficiency, unspecified: Secondary | ICD-10-CM | POA: Diagnosis not present

## 2022-07-16 DIAGNOSIS — Z1331 Encounter for screening for depression: Secondary | ICD-10-CM | POA: Diagnosis not present

## 2022-07-16 DIAGNOSIS — R7309 Other abnormal glucose: Secondary | ICD-10-CM | POA: Diagnosis not present

## 2022-07-16 DIAGNOSIS — R011 Cardiac murmur, unspecified: Secondary | ICD-10-CM | POA: Diagnosis not present

## 2022-07-16 DIAGNOSIS — Z6826 Body mass index (BMI) 26.0-26.9, adult: Secondary | ICD-10-CM | POA: Diagnosis not present

## 2022-07-16 DIAGNOSIS — Z0001 Encounter for general adult medical examination with abnormal findings: Secondary | ICD-10-CM | POA: Diagnosis not present

## 2022-07-16 DIAGNOSIS — M0589 Other rheumatoid arthritis with rheumatoid factor of multiple sites: Secondary | ICD-10-CM | POA: Diagnosis not present

## 2022-07-16 DIAGNOSIS — E039 Hypothyroidism, unspecified: Secondary | ICD-10-CM | POA: Diagnosis not present

## 2022-07-19 ENCOUNTER — Other Ambulatory Visit (HOSPITAL_COMMUNITY): Payer: Self-pay | Admitting: Family Medicine

## 2022-07-19 DIAGNOSIS — R011 Cardiac murmur, unspecified: Secondary | ICD-10-CM

## 2022-07-19 DIAGNOSIS — Z0001 Encounter for general adult medical examination with abnormal findings: Secondary | ICD-10-CM

## 2022-07-20 ENCOUNTER — Other Ambulatory Visit (HOSPITAL_COMMUNITY): Payer: Self-pay | Admitting: Family Medicine

## 2022-07-20 DIAGNOSIS — R011 Cardiac murmur, unspecified: Secondary | ICD-10-CM

## 2022-07-26 ENCOUNTER — Other Ambulatory Visit (HOSPITAL_COMMUNITY): Payer: Self-pay | Admitting: Family Medicine

## 2022-07-26 ENCOUNTER — Ambulatory Visit (HOSPITAL_COMMUNITY)
Admission: RE | Admit: 2022-07-26 | Discharge: 2022-07-26 | Disposition: A | Discharge status: Home / Self Care | Payer: Medicare PPO | Source: Ambulatory Visit | Attending: Family Medicine | Admitting: Family Medicine

## 2022-07-26 DIAGNOSIS — R011 Cardiac murmur, unspecified: Secondary | ICD-10-CM

## 2022-07-26 DIAGNOSIS — M1811 Unilateral primary osteoarthritis of first carpometacarpal joint, right hand: Secondary | ICD-10-CM | POA: Diagnosis not present

## 2022-07-26 DIAGNOSIS — M25541 Pain in joints of right hand: Secondary | ICD-10-CM | POA: Diagnosis not present

## 2022-07-26 DIAGNOSIS — Z0001 Encounter for general adult medical examination with abnormal findings: Secondary | ICD-10-CM

## 2022-07-29 ENCOUNTER — Ambulatory Visit (HOSPITAL_COMMUNITY)
Admission: RE | Admit: 2022-07-29 | Discharge: 2022-07-29 | Disposition: A | Discharge status: Home / Self Care | Payer: Medicare PPO | Source: Ambulatory Visit | Attending: Family Medicine | Admitting: Family Medicine

## 2022-07-29 DIAGNOSIS — I739 Peripheral vascular disease, unspecified: Secondary | ICD-10-CM | POA: Diagnosis not present

## 2022-07-29 DIAGNOSIS — Z0001 Encounter for general adult medical examination with abnormal findings: Secondary | ICD-10-CM | POA: Insufficient documentation

## 2022-08-18 DIAGNOSIS — M549 Dorsalgia, unspecified: Secondary | ICD-10-CM | POA: Diagnosis not present

## 2022-08-18 DIAGNOSIS — L4050 Arthropathic psoriasis, unspecified: Secondary | ICD-10-CM | POA: Diagnosis not present

## 2022-08-18 DIAGNOSIS — Z79899 Other long term (current) drug therapy: Secondary | ICD-10-CM | POA: Diagnosis not present

## 2022-08-18 DIAGNOSIS — M1990 Unspecified osteoarthritis, unspecified site: Secondary | ICD-10-CM | POA: Diagnosis not present

## 2022-08-18 DIAGNOSIS — M79671 Pain in right foot: Secondary | ICD-10-CM | POA: Diagnosis not present

## 2022-08-18 DIAGNOSIS — M797 Fibromyalgia: Secondary | ICD-10-CM | POA: Diagnosis not present

## 2022-09-02 DIAGNOSIS — Z1159 Encounter for screening for other viral diseases: Secondary | ICD-10-CM | POA: Diagnosis not present

## 2022-09-02 DIAGNOSIS — Z7289 Other problems related to lifestyle: Secondary | ICD-10-CM | POA: Diagnosis not present

## 2022-09-02 DIAGNOSIS — M1990 Unspecified osteoarthritis, unspecified site: Secondary | ICD-10-CM | POA: Diagnosis not present

## 2022-09-02 DIAGNOSIS — M89272 Other disorders of bone development and growth, left ankle and foot: Secondary | ICD-10-CM | POA: Diagnosis not present

## 2022-09-02 DIAGNOSIS — M85872 Other specified disorders of bone density and structure, left ankle and foot: Secondary | ICD-10-CM | POA: Diagnosis not present

## 2022-09-02 DIAGNOSIS — M461 Sacroiliitis, not elsewhere classified: Secondary | ICD-10-CM | POA: Diagnosis not present

## 2022-09-02 DIAGNOSIS — M89271 Other disorders of bone development and growth, right ankle and foot: Secondary | ICD-10-CM | POA: Diagnosis not present

## 2022-09-02 DIAGNOSIS — L409 Psoriasis, unspecified: Secondary | ICD-10-CM | POA: Diagnosis not present

## 2022-09-02 DIAGNOSIS — R936 Abnormal findings on diagnostic imaging of limbs: Secondary | ICD-10-CM | POA: Diagnosis not present

## 2022-09-02 DIAGNOSIS — G629 Polyneuropathy, unspecified: Secondary | ICD-10-CM | POA: Diagnosis not present

## 2022-09-02 DIAGNOSIS — L4050 Arthropathic psoriasis, unspecified: Secondary | ICD-10-CM | POA: Diagnosis not present

## 2022-09-09 ENCOUNTER — Ambulatory Visit (HOSPITAL_COMMUNITY): Payer: Medicare PPO

## 2022-09-09 ENCOUNTER — Other Ambulatory Visit (HOSPITAL_COMMUNITY): Payer: Self-pay | Admitting: Family Medicine

## 2022-09-09 DIAGNOSIS — Z1231 Encounter for screening mammogram for malignant neoplasm of breast: Secondary | ICD-10-CM

## 2022-09-10 ENCOUNTER — Ambulatory Visit (HOSPITAL_COMMUNITY)
Admission: RE | Admit: 2022-09-10 | Discharge: 2022-09-10 | Disposition: A | Discharge status: Home / Self Care | Payer: Medicare PPO | Source: Ambulatory Visit | Attending: Family Medicine | Admitting: Family Medicine

## 2022-09-10 DIAGNOSIS — R011 Cardiac murmur, unspecified: Secondary | ICD-10-CM | POA: Insufficient documentation

## 2022-09-10 LAB — ECHOCARDIOGRAM COMPLETE
AR max vel: 1.81 cm2
AV Area VTI: 2.19 cm2
AV Area mean vel: 2.14 cm2
AV Mean grad: 6.2 mmHg
AV Peak grad: 14.9 mmHg
Ao pk vel: 1.93 m/s
Area-P 1/2: 2.29 cm2
S' Lateral: 2.4 cm

## 2022-09-10 NOTE — Progress Notes (Signed)
*  PRELIMINARY RESULTS* Echocardiogram 2D Echocardiogram has been performed.  Stacey Drain 09/10/2022, 10:16 AM

## 2022-09-13 DIAGNOSIS — H43391 Other vitreous opacities, right eye: Secondary | ICD-10-CM | POA: Diagnosis not present

## 2022-09-13 DIAGNOSIS — H43812 Vitreous degeneration, left eye: Secondary | ICD-10-CM | POA: Diagnosis not present

## 2022-09-13 DIAGNOSIS — Z961 Presence of intraocular lens: Secondary | ICD-10-CM | POA: Diagnosis not present

## 2022-09-15 ENCOUNTER — Ambulatory Visit (HOSPITAL_COMMUNITY)
Admission: RE | Admit: 2022-09-15 | Discharge: 2022-09-15 | Disposition: A | Discharge status: Home / Self Care | Payer: Medicare PPO | Source: Ambulatory Visit | Attending: Family Medicine | Admitting: Family Medicine

## 2022-09-15 DIAGNOSIS — Z0001 Encounter for general adult medical examination with abnormal findings: Secondary | ICD-10-CM | POA: Insufficient documentation

## 2022-09-15 DIAGNOSIS — R011 Cardiac murmur, unspecified: Secondary | ICD-10-CM | POA: Insufficient documentation

## 2022-09-24 ENCOUNTER — Ambulatory Visit (HOSPITAL_COMMUNITY)
Admission: RE | Admit: 2022-09-24 | Discharge: 2022-09-24 | Disposition: A | Discharge status: Home / Self Care | Payer: Medicare PPO | Source: Ambulatory Visit | Attending: Family Medicine | Admitting: Family Medicine

## 2022-09-24 ENCOUNTER — Encounter (HOSPITAL_COMMUNITY): Payer: Self-pay

## 2022-09-24 DIAGNOSIS — Z1231 Encounter for screening mammogram for malignant neoplasm of breast: Secondary | ICD-10-CM | POA: Diagnosis not present

## 2022-09-28 ENCOUNTER — Encounter: Payer: Self-pay | Admitting: Neurology

## 2022-09-29 ENCOUNTER — Encounter: Payer: Self-pay | Admitting: Internal Medicine

## 2022-09-29 ENCOUNTER — Other Ambulatory Visit (HOSPITAL_COMMUNITY): Payer: Self-pay

## 2022-09-29 ENCOUNTER — Ambulatory Visit: Payer: Medicare PPO | Attending: Internal Medicine | Admitting: Internal Medicine

## 2022-09-29 VITALS — BP 140/80 | HR 89 | Ht 64.0 in | Wt 157.2 lb

## 2022-09-29 DIAGNOSIS — E7849 Other hyperlipidemia: Secondary | ICD-10-CM | POA: Diagnosis not present

## 2022-09-29 DIAGNOSIS — Z789 Other specified health status: Secondary | ICD-10-CM | POA: Insufficient documentation

## 2022-09-29 DIAGNOSIS — E785 Hyperlipidemia, unspecified: Secondary | ICD-10-CM | POA: Insufficient documentation

## 2022-09-29 DIAGNOSIS — R931 Abnormal findings on diagnostic imaging of heart and coronary circulation: Secondary | ICD-10-CM | POA: Insufficient documentation

## 2022-09-29 MED ORDER — BEMPEDOIC ACID-EZETIMIBE 180-10 MG PO TABS: 1.0000 | ORAL_TABLET | Freq: Every day | ORAL | 11 refills | Status: DC

## 2022-09-29 MED ORDER — ASPIRIN 81 MG PO TBEC: 81.0000 mg | DELAYED_RELEASE_TABLET | Freq: Every day | ORAL | Status: AC

## 2022-09-29 NOTE — Progress Notes (Signed)
Cardiology Office Note  Date: 09/29/2022   ID: Merrick, Probst 01-31-1950, MRN [id number]  PCP:  Assunta Found, MD  Cardiologist:  Marjo Bicker, MD Electrophysiologist:  None   History of Present Illness: Savannah Watson is a 72 y.o. female known to have psoriatic arthritis, severe hypercholesteremia was referred to cardiology for evaluation of HLD and elevated coronary calcium score.  Ongoing chronic muscle aches x 5 years since she was diagnosed with psoriatic arthritis and has been on multiple injectable medications.  This muscle aches occur at night and not in the morning she is active at baseline, METs more than 4 in the AM.  No angina, DOE, orthopnea, PND, dizziness, presyncope and syncope.  Past Medical History:  Diagnosis Date   Arthritis    Heel spur    L/R   High cholesterol    Plantar fasciitis    Psoriasis    Psoriatic arthritis ([facility])    Skin cancer    Sleep apnea    Thyroid disorder    Tingling of both feet    Vertigo     Past Surgical History:  Procedure Laterality Date   ABDOMINAL HYSTERECTOMY  06/1990   BACK SURGERY  06/1992    Current Outpatient Medications  Medication Sig Dispense Refill   Apremilast (OTEZLA) 30 MG TABS Take 30 mg by mouth 2 (two) times daily.     aspirin EC 81 MG tablet Take 1 tablet (81 mg total) by mouth daily. Swallow whole.     Bempedoic Acid-Ezetimibe 180-10 MG TABS Take 1 tablet by mouth daily. 30 tablet 11   COSENTYX SENSOREADY, 300 MG, 150 MG/ML SOAJ Inject 1 mL into the skin once a week.     folic acid (FOLVITE) 1 MG tablet Take 1 mg by mouth daily.     ibuprofen (ADVIL) 600 MG tablet Take 1 tablet (600 mg total) by mouth every 6 (six) hours as needed. 30 tablet 0   levothyroxine (SYNTHROID) 75 MCG tablet daily.     omeprazole (PRILOSEC) 20 MG capsule Take 20 mg by mouth as needed.     pregabalin (LYRICA) 150 MG capsule Take 150 mg by mouth 2 (two) times daily.     pregabalin (LYRICA) 75 MG capsule Take 75 mg by  mouth 2 (two) times daily.     sulfaSALAzine (AZULFIDINE) 500 MG EC tablet 1,500 mg.     Vitamin D, Ergocalciferol, 50000 units CAPS Take 1 capsule by mouth once a week.     No current facility-administered medications for this visit.   Allergies:  Erythromycin   Social History: The patient  reports that she has never smoked. She has never used smokeless tobacco. She reports that she does not drink alcohol and does not use drugs.   Family History: The patient's family history includes Alcoholism in her father and mother; Bipolar disorder in her sister; Heart failure in her mother; Kidney Stones in her sister; Scoliosis in her sister.   ROS:  Please see the history of present illness. Otherwise, complete review of systems is positive for none.  All other systems are reviewed and negative.   Physical Exam: VS:  BP (!) 140/80   Pulse 89   Ht 5\' 4"  (1.626 m)   Wt 157 lb 3.2 oz (71.3 kg)   SpO2 95%   BMI 26.98 kg/m , BMI Body mass index is 26.98 kg/m.  Wt Readings from Last 3 Encounters:  09/29/22 157 lb 3.2 oz (71.3 kg)  04/30/21 147 lb (66.7 kg)  09/02/20 142 lb 2 oz (64.5 kg)    General: Patient appears comfortable at rest. HEENT: Conjunctiva and lids normal, oropharynx clear with moist mucosa. Neck: Supple, no elevated JVP or carotid bruits, no thyromegaly. Lungs: Clear to auscultation, nonlabored breathing at rest. Cardiac: Regular rate and rhythm, no S3 or significant systolic murmur, no pericardial rub. Abdomen: Soft, nontender, no hepatomegaly, bowel sounds present, no guarding or rebound. Extremities: No pitting edema, distal pulses 2+. Skin: Warm and dry. Musculoskeletal: No kyphosis. Neuropsychiatric: Alert and oriented x3, affect grossly appropriate.  Recent Labwork: No results found for requested labs within last 365 days.  No results found for: "CHOL", "TRIG", "HDL", "CHOLHDL", "VLDL", "LDLCALC", "LDLDIRECT"   Assessment and Plan:  Severe hypercholesterolemia:  I reviewed lipid panel from 06/2022 (PCP notes under media) that showed LDL 202, TG 95, total cholesterol 275.  She tried atorvastatin in the past, had severe myalgias. She has not tried a different statin due to ongoing muscle aches from psoriatic arthritis.  Will start bempedoic acid-ezetimibe 180 mg - 10 mg once daily.  Patient wants to start this medication after 2 months, which is okay.  Obtain baseline uric acid levels.  I discussed the side effects of this medication with the patient.  If insurance denies/gets pushback, she is agreeable to starting injectables including PCSK9 elevators or inclisiran.  No family history premature CAD although she does have an elevated coronary calcium score of 317 which is at 41 percentile for age and sex matched control.  Repeat lipid panel in 6 months before the next clinic visit.  Elevated coronary calcium score: CAC score was 317 (84% for age and sex matched control) in 09/2022.  No symptoms of angina or DOE.  METs more than 4 at baseline.  Echocardiogram in 09/2022 showed normal LVEF and normal heart disease.  Start aspirin mg once daily and bempedoic acid-Zetia 180-10 mg once daily.  Heart healthy diet and exercise (30 minutes a day for 5 days a week).    Medication Adjustments/Labs and Tests Ordered: Current medicines are reviewed at length with the patient today.  Concerns regarding medicines are outlined above.    Disposition:  Follow up 6 months  Signed, Stevens Magwood Verne Spurr, MD, 09/29/2022 12:46 PM    [facility] [address] [place], [place] [address]

## 2022-09-29 NOTE — Patient Instructions (Signed)
Medication Instructions:  Your physician has recommended you make the following change in your medication:   -Start Aspirin 81 mg tablet once daily -Start Bempedoic Acid-Zetia 180-10 mg tablet once daily  *If you need a refill on your cardiac medications before your next appointment, please call your pharmacy*   Lab Work: In 6 months: -Lipid Panel  If you have labs (blood work) drawn today and your tests are completely normal, you will receive your results only by: MyChart Message (if you have MyChart) OR A paper copy in the mail If you have any lab test that is abnormal or we need to change your treatment, we will call you to review the results.   Testing/Procedures: None   Follow-Up: At [facility], you and your health needs are our priority.  As part of our continuing mission to provide you with exceptional heart care, we have created designated Provider Care Teams.  These Care Teams include your primary Cardiologist (physician) and Advanced Practice Providers (APPs -  Physician Assistants and Nurse Practitioners) who all work together to provide you with the care you need, when you need it.  We recommend signing up for the patient portal called "MyChart".  Sign up information is provided on this After Visit Summary.  MyChart is used to connect with patients for Virtual Visits (Telemedicine).  Patients are able to view lab/test results, encounter notes, upcoming appointments, etc.  Non-urgent messages can be sent to your provider as well.   To learn more about what you can do with MyChart, go to [email or web address].    Your next appointment:   6 month(s)  Provider:   You may see Vishnu P Mallipeddi, MD or one of the following Advanced Practice Providers on your designated Care Team:   [place], PA-C  Jacolyn Reedy, [place]     Other Instructions

## 2022-09-30 ENCOUNTER — Telehealth: Payer: Self-pay | Admitting: Pharmacy Technician

## 2022-09-30 ENCOUNTER — Other Ambulatory Visit (HOSPITAL_COMMUNITY): Payer: Self-pay

## 2022-09-30 NOTE — Telephone Encounter (Signed)
Pharmacy Patient Advocate Encounter   Received notification from CoverMyMeds that prior authorization for nexlizet is required/requested.   Insurance verification completed.   The patient is insured through [employer] .   Per test claim: PA required; PA submitted to [facility] via CoverMyMeds Key/confirmation #/EOC [id number] Status is pending

## 2022-09-30 NOTE — Telephone Encounter (Signed)
Pharmacy Patient Advocate Encounter  Received notification from [facility] that Prior Authorization for nexlizet has been APPROVED from 01/04/22 to 01/04/23. Ran test claim, Copay is $0.00-no copay. This test claim was processed through [facility]- copay amounts may vary at other pharmacies due to pharmacy/plan contracts, or as the patient moves through the different stages of their insurance plan.   PA #/Case ID/Reference #: [id number]

## 2022-10-14 DIAGNOSIS — G629 Polyneuropathy, unspecified: Secondary | ICD-10-CM | POA: Diagnosis not present

## 2022-10-14 DIAGNOSIS — L4050 Arthropathic psoriasis, unspecified: Secondary | ICD-10-CM | POA: Diagnosis not present

## 2022-10-14 DIAGNOSIS — M1990 Unspecified osteoarthritis, unspecified site: Secondary | ICD-10-CM | POA: Diagnosis not present

## 2022-10-25 ENCOUNTER — Ambulatory Visit: Payer: Medicare PPO | Admitting: Neurology

## 2022-10-25 ENCOUNTER — Encounter: Payer: Self-pay | Admitting: Neurology

## 2022-10-25 VITALS — BP 161/74 | HR 77 | Ht 64.0 in | Wt 158.0 lb

## 2022-10-25 DIAGNOSIS — Z23 Encounter for immunization: Secondary | ICD-10-CM | POA: Diagnosis not present

## 2022-10-25 DIAGNOSIS — M79604 Pain in right leg: Secondary | ICD-10-CM | POA: Diagnosis not present

## 2022-10-25 DIAGNOSIS — M79605 Pain in left leg: Secondary | ICD-10-CM

## 2022-10-25 NOTE — Progress Notes (Signed)
[facility] Neurology Division Clinic Note - Initial Visit   Date: 10/25/2022   Savannah Watson MRN: [id number] DOB: 1950-08-08   Dear Dr. Tomasa Rand:  Thank you for your kind referral of Savannah Watson for consultation of bilateral leg pain. Although her history is well known to you, please allow Savannah Watson to reiterate it for the purpose of our medical record. The patient was accompanied to the clinic by husband who also provides collateral information.     Savannah Watson is a 72 y.o. right-handed female with melanoma (2005), psoriasis, psoriatic arthritis, hypothyroidism, and GERD presenting for evaluation of bilateral leg pain.   IMPRESSION/PLAN: Bilateral leg pain, described as achy.  Her neurological exam is normal. No evidence of neuropathy based on her history, exam, and prior NCS/EMG which was normal.  Pain is not consistent with small fiber neuropathy.  With that said, I offered skin biopsy given that autoimmune disease can be associated with small fiber neuropathy, but explained that management is still symptomatic and would not explain her achy leg pain.  I recommend that she continue to see pain management for symptom control.   ------------------------------------------------------------- History of present illness: Starting around 2021, she began having pain described as achy pain involving bilateral calf muscles.  It is worse at night time and often wakes her up from sleeping.  She does not have the pain during the daytime.  The diagnosis of RLS was raised at one point and she was tried on gabapentin, ropinirole, and mirapex which did not help.  She often has to get up and walk to get relief.  She has tried heat, NSAIDs, and tylenol.  No associated weakness or numbness.  No imbalance or falls.  She currently takes Lyrica 200mg  at bedtime and baclofen 10mg  at bedtime which has allowed her to sleep better.  She has seen orthopaedics, neurology, chiropractor, and PT.  She is now seeing  pain management.   Previously tried:  gabapentin, cymbalta, flexeril, pramipexole, and ropinirole with no benefit.  She is concerned about weight gain with Lyrica.   Out-side paper records, electronic medical record, and images have been reviewed where available and summarized as:  NCs/EMG of the legs 11/8/202: Nerve conduction studies done on both lower extremities were within normal limits.  No evidence of a neuropathy was seen.  A small fiber neuropathy cannot be fully excluded by the study however, clinical correlation is required.  EMG evaluation of the right lower extremity was unremarkable, no evidence of an overlying lumbosacral radiculopathy was seen.   MRI lumbar spine wwo contrast 01/15/2020" 1. No significant interval change in mild degenerative changes of the lumbar spine without significant foraminal or canal stenosis at any level. 2. Previously seen central disc protrusion at L3-4 has resolved. 3. Previous left laminectomy at L5-S1 with minimal perineural scarring around the posterior and posterolateral margins of the left S1 nerve root within the subarticular recess. Unchanged borderline left foraminal narrowing at this level without evidence of impingement.    No results found for: "HGBA1C":  Lab Results  Component Value Date   VITAMINB12 360 10/04/2019   No results found for: "TSH" Lab Results  Component Value Date   ESRSEDRATE 14 10/04/2019    Past Medical History:  Diagnosis Date   Arthritis    Heel spur    L/R   High cholesterol    Plantar fasciitis    Psoriasis    Psoriatic arthritis (HCC)    Skin cancer    Sleep apnea  Thyroid disorder    Tingling of both feet    Vertigo     Past Surgical History:  Procedure Laterality Date   ABDOMINAL HYSTERECTOMY  06/1990   BACK SURGERY  06/1992     Medications:  Outpatient Encounter Medications as of 10/25/2022  Medication Sig   aspirin EC 81 MG tablet Take 1 tablet (81 mg total) by mouth daily.  Swallow whole.   baclofen (LIORESAL) 10 MG tablet Take by mouth. Take one tablet at bedtime.   Bempedoic Acid-Ezetimibe 180-10 MG TABS Take 1 tablet by mouth daily.   COSENTYX SENSOREADY, 300 MG, 150 MG/ML SOAJ Inject 1 mL into the skin once a week.   folic acid (FOLVITE) 1 MG tablet Take 1 mg by mouth daily.   ibuprofen (ADVIL) 600 MG tablet Take 1 tablet (600 mg total) by mouth every 6 (six) hours as needed.   levothyroxine (SYNTHROID) 75 MCG tablet daily.   omeprazole (PRILOSEC) 20 MG capsule Take 20 mg by mouth as needed.   pregabalin (LYRICA) 100 MG capsule Take 200 mg by mouth daily. Take 200 mg at 7pm   sulfaSALAzine (AZULFIDINE) 500 MG EC tablet 1,500 mg. Take 3 tablets by mouth in the morning and 3 tablets by mouth in the evening   [DISCONTINUED] Apremilast (OTEZLA) 30 MG TABS Take 30 mg by mouth 2 (two) times daily. (Patient not taking: Reported on 10/25/2022)   [DISCONTINUED] pregabalin (LYRICA) 150 MG capsule Take 150 mg by mouth 2 (two) times daily. (Patient not taking: Reported on 10/25/2022)   [DISCONTINUED] Vitamin D, Ergocalciferol, 50000 units CAPS Take 1 capsule by mouth once a week.   No facility-administered encounter medications on file as of 10/25/2022.    Allergies:  Allergies  Allergen Reactions   Erythromycin Nausea Only    Family History: Family History  Problem Relation Age of Onset   Heart failure Mother    Alcoholism Mother    Alcoholism Father    Cirrhosis Father    Bipolar disorder Sister    Scoliosis Sister    Kidney Stones Sister     Social History: Social History   Tobacco Use   Smoking status: Never   Smokeless tobacco: Never  Substance Use Topics   Alcohol use: Never   Drug use: Never   Social History   Social History Narrative   Lives with spouse   Caffeine 2-3 c daily      Are you right handed or left handed? Right Handed    Are you currently employed ? Retired   What is your current occupation?   Do you live at home alone?  No    Who lives with you? Lives with husband    What type of home do you live in: 1 story or 2 story? Lives in a two story home        Vital Signs:  BP (!) 161/74   Pulse 77   Ht 5\' 4"  (1.626 m)   Wt 158 lb (71.7 kg)   SpO2 96%   BMI 27.12 kg/m   Neurological Exam: MENTAL STATUS including orientation to time, place, person, recent and remote memory, attention span and concentration, language, and fund of knowledge is normal.  Speech is not dysarthric.  CRANIAL NERVES: II:  No visual field defects.     III-IV-VI: Pupils equal round and reactive to light.  Normal conjugate, extra-ocular eye movements in all directions of gaze.  No nystagmus.  No ptosis.   V:  Normal facial sensation.  VII:  Normal facial symmetry and movements.   VIII:  Normal hearing and vestibular function.   IX-X:  Normal palatal movement.   XI:  Normal shoulder shrug and head rotation.   XII:  Normal tongue strength and range of motion, no deviation or fasciculation.  MOTOR:  No atrophy, fasciculations or abnormal movements.  No pronator drift.   Upper Extremity:  Right  Left  Deltoid  5/5   5/5   Biceps  5/5   5/5   Triceps  5/5   5/5   Wrist extensors  5/5   5/5   Wrist flexors  5/5   5/5   Finger extensors  5/5   5/5   Finger flexors  5/5   5/5   Dorsal interossei  5/5   5/5   Abductor pollicis  5/5   5/5   Tone (Ashworth scale)  0  0   Lower Extremity:  Right  Left  Hip flexors  5/5   5/5   Knee flexors  5/5   5/5   Knee extensors  5/5   5/5   Dorsiflexors  5/5   5/5   Plantarflexors  5/5   5/5   Toe extensors  5/5   5/5   Toe flexors  5/5   5/5   Tone (Ashworth scale)  0  0   MSRs:                                           Right        Left brachioradialis 2+  2+  biceps 2+  2+  triceps 2+  2+  patellar 2+  2+  ankle jerk 2+  2+  Hoffman no  no  plantar response down  down   SENSORY:  Normal and symmetric perception of light touch, pinprick, vibration, and proprioception.   Romberg's sign absent.   COORDINATION/GAIT: Normal finger-to- nose-finger.  Intact rapid alternating movements bilaterally.  Able to rise from a chair without using arms.  Gait narrow based and stable. Tandem and stressed gait intact.      Thank you for allowing me to participate in patient's care.  If I can answer any additional questions, I would be pleased to do so.    Sincerely,    Cherylee Rawlinson K. Allena Katz, DO

## 2022-10-26 ENCOUNTER — Ambulatory Visit: Payer: Medicare PPO | Admitting: Internal Medicine

## 2022-11-28 ENCOUNTER — Ambulatory Visit: Payer: Medicare PPO

## 2022-11-28 ENCOUNTER — Ambulatory Visit
Admission: EM | Admit: 2022-11-28 | Discharge: 2022-11-28 | Disposition: A | Discharge status: Home / Self Care | Payer: Medicare PPO | Attending: Nurse Practitioner | Admitting: Nurse Practitioner

## 2022-11-28 DIAGNOSIS — R198 Other specified symptoms and signs involving the digestive system and abdomen: Secondary | ICD-10-CM

## 2022-11-28 DIAGNOSIS — R059 Cough, unspecified: Secondary | ICD-10-CM | POA: Diagnosis not present

## 2022-11-28 DIAGNOSIS — J019 Acute sinusitis, unspecified: Secondary | ICD-10-CM | POA: Diagnosis not present

## 2022-11-28 DIAGNOSIS — R0989 Other specified symptoms and signs involving the circulatory and respiratory systems: Secondary | ICD-10-CM | POA: Diagnosis not present

## 2022-11-28 MED ORDER — ALUM & MAG HYDROXIDE-SIMETH 200-200-20 MG/5ML PO SUSP
30.0000 mL | Freq: Once | ORAL | Status: AC
  Administered 2022-11-28: 30 mL via ORAL

## 2022-11-28 MED ORDER — MYLANTA MAXIMUM STRENGTH 400-400-40 MG/5ML PO SUSP: 10.0000 mL | Freq: Four times a day (QID) | ORAL | 0 refills | Status: AC | PRN

## 2022-11-28 MED ORDER — AMOXICILLIN-POT CLAVULANATE 875-125 MG PO TABS: 1.0000 | ORAL_TABLET | Freq: Two times a day (BID) | ORAL | 0 refills | Status: AC

## 2022-11-28 MED ORDER — LIDOCAINE VISCOUS HCL 2 % MT SOLN
15.0000 mL | Freq: Once | OROMUCOSAL | Status: AC
  Administered 2022-11-28: 15 mL via OROMUCOSAL

## 2022-11-28 NOTE — Discharge Instructions (Addendum)
The chest x-ray was negative for pneumonia. Take medication as prescribed. Increase fluids and allow for plenty of rest. May take over-the-counter ibuprofen or Tylenol as needed for pain, fever, or general discomfort. Normal saline nasal spray throughout the day to help with nasal congestion and runny nose. Try to avoid foods that may trigger reflux symptoms to include caffeine, dairy, spicy foods, tomato-based foods, or mint based foods. Continue to eat smaller meal sizes and chew your food well before swallowing. As discussed, if symptoms do not improve with this treatment, please follow-up with your primary care physician for further evaluation. Follow-up as needed.

## 2022-11-28 NOTE — ED Triage Notes (Signed)
Pt reports she has chest congestion, chest tightnesscough, headache, bilateral ear pain,"esophagus pain" and eye swelling x 6 days. Deep cough worsened this morning

## 2022-11-28 NOTE — ED Provider Notes (Signed)
[facility]    CSN: [id number] Arrival date & time: 11/28/22  3086      History   Chief Complaint Chief Complaint  Patient presents with   Cough    HPI Savannah Watson is a 72 y.o. female.   The history is provided by the patient.   Patient presents for complaints of chest congestion, chest tightness, cough, headache, ear pain, and pain in her esophagus that has been present for the past 6 days.  She denies fever, chills, ear pain, wheezing, difficulty breathing, chest pain, abdominal pain, nausea, vomiting, or diarrhea.  Patient states cough worsened this morning.  Patient states that when she eats, she feels like there is something "stuck" in her chest, and states she has been has a difficult time with liquids.  Patient reports that she takes medication for reflux symptoms on occasion.  States that she did try taking the medication with minimal relief.    Past Medical History:  Diagnosis Date   Arthritis    Heel spur    L/R   High cholesterol    Plantar fasciitis    Psoriasis    Psoriatic arthritis (HCC)    Skin cancer    Sleep apnea    Thyroid disorder    Tingling of both feet    Vertigo     Patient Active Problem List   Diagnosis Date Noted   HLD (hyperlipidemia) 09/29/2022   Statin intolerance 09/29/2022   Elevated coronary artery calcium score 09/29/2022   Body aches 04/30/2021   Psoriatic arthritis (HCC) 04/30/2021   Paresthesia 10/04/2019   Plantar fasciitis of right foot 03/29/2019   Shoulder pain 04/01/2011   Shoulder stiffness 04/01/2011   Muscle weakness (generalized) 04/01/2011    Past Surgical History:  Procedure Laterality Date   ABDOMINAL HYSTERECTOMY  06/1990   BACK SURGERY  06/1992    OB History   No obstetric history on file.      Home Medications    Prior to Admission medications   Medication Sig Start Date End Date Taking? Authorizing Provider  alum & mag hydroxide-simeth (MYLANTA MAXIMUM STRENGTH) 400-400-40 MG/5ML  suspension Take 10 mLs by mouth every 6 (six) hours as needed for indigestion. 11/28/22  Yes Leath-Warren, Sadie Haber, NP  amoxicillin-clavulanate (AUGMENTIN) 875-125 MG tablet Take 1 tablet by mouth every 12 (twelve) hours for 7 days. 11/28/22 12/05/22 Yes Leath-Warren, Sadie Haber, NP  aspirin EC 81 MG tablet Take 1 tablet (81 mg total) by mouth daily. Swallow whole. 09/29/22   Mallipeddi, Vishnu P, MD  baclofen (LIORESAL) 10 MG tablet Take by mouth. Take one tablet at bedtime. 10/14/22   [provider]  Bempedoic Acid-Ezetimibe 180-10 MG TABS Take 1 tablet by mouth daily. 09/29/22   Mallipeddi, Vishnu P, MD  COSENTYX SENSOREADY, 300 MG, 150 MG/ML SOAJ Inject 1 mL into the skin once a week. 09/23/22   [provider]  folic acid (FOLVITE) 1 MG tablet Take 1 mg by mouth daily. 04/15/21   [provider]  ibuprofen (ADVIL) 600 MG tablet Take 1 tablet (600 mg total) by mouth every 6 (six) hours as needed. 06/29/20   Domenick Gong, MD  levothyroxine (SYNTHROID) 75 MCG tablet daily.    [provider]  omeprazole (PRILOSEC) 20 MG capsule Take 20 mg by mouth as needed. 04/09/19   [provider]  pregabalin (LYRICA) 100 MG capsule Take 200 mg by mouth daily. Take 200 mg at 7pm 08/03/22   [provider]  sulfaSALAzine (AZULFIDINE) 500 MG EC tablet 1,500 mg. Take 3 tablets by mouth in the morning and 3 tablets by mouth in the evening    [provider]    Family History Family History  Problem Relation Age of Onset   Heart failure Mother    Alcoholism Mother    Alcoholism Father    Cirrhosis Father    Bipolar disorder Sister    Scoliosis Sister    Kidney Stones Sister     Social History Social History   Tobacco Use   Smoking status: Never   Smokeless tobacco: Never  Substance Use Topics   Alcohol use: Never   Drug use: Never     Allergies   Erythromycin   Review of Systems Review of Systems Per HPI  Physical  Exam Triage Vital Signs ED Triage Vitals  Encounter Vitals Group     BP 11/28/22 0943 (!) 162/76     Systolic BP Percentile --      Diastolic BP Percentile --      Pulse Rate 11/28/22 0943 78     Resp 11/28/22 0943 16     Temp 11/28/22 0943 98.4 F (36.9 C)     Temp Source 11/28/22 0943 Oral     SpO2 11/28/22 0943 92 %     Weight --      Height --      Head Circumference --      Peak Flow --      Pain Score 11/28/22 0946 3     Pain Loc --      Pain Education --      Exclude from Growth Chart --    No data found.  Updated Vital Signs BP (!) 162/76 (BP Location: Right Arm)   Pulse 78   Temp 98.4 F (36.9 C) (Oral)   Resp 16   SpO2 92%   Visual Acuity Right Eye Distance: 20/30 Left Eye Distance: 20/20 Bilateral Distance: 2025  Right Eye Near:   Left Eye Near:    Bilateral Near:     Physical Exam Vitals and nursing note reviewed.  Constitutional:      General: She is not in acute distress.    Appearance: Normal appearance.  HENT:     Head: Normocephalic.     Right Ear: Tympanic membrane, ear canal and external ear normal.     Left Ear: Tympanic membrane, ear canal and external ear normal.     Nose: Congestion present.     Right Turbinates: Enlarged and swollen.     Left Turbinates: Enlarged and swollen.     Right Sinus: No maxillary sinus tenderness or frontal sinus tenderness.     Left Sinus: No maxillary sinus tenderness or frontal sinus tenderness.     Mouth/Throat:     Lips: Pink.     Mouth: Mucous membranes are moist.     Pharynx: Oropharynx is clear. Uvula midline. Posterior oropharyngeal erythema and postnasal drip present. No pharyngeal swelling.     Comments: Cobblestoning present to posterior oropharynx  Eyes:     Extraocular Movements: Extraocular movements intact.     Conjunctiva/sclera: Conjunctivae normal.     Pupils: Pupils are equal, round, and reactive to light.  Cardiovascular:     Rate and Rhythm: Normal rate and regular rhythm.      Pulses: Normal pulses.     Heart sounds: Normal heart sounds.  Pulmonary:     Effort: Pulmonary effort is normal.     Breath sounds:  Normal breath sounds.  Abdominal:     General: Bowel sounds are normal.     Palpations: Abdomen is soft.     Tenderness: There is no abdominal tenderness.  Musculoskeletal:     Cervical back: Normal range of motion.  Lymphadenopathy:     Cervical: No cervical adenopathy.  Skin:    General: Skin is warm and dry.  Neurological:     General: No focal deficit present.     Mental Status: She is alert and oriented to person, place, and time.  Psychiatric:        Mood and Affect: Mood normal.        Behavior: Behavior normal.      UC Treatments / Results  Labs (all labs ordered are listed, but only abnormal results are displayed) Labs Reviewed - No data to display  EKG   Radiology DG Chest 2 View  Result Date: 11/28/2022 CLINICAL DATA:  Cough and congestion for the past 6 days. EXAM: CHEST - 2 VIEW COMPARISON:  Chest x-ray dated October 04, 2003. FINDINGS: The heart size and mediastinal contours are within normal limits. Both lungs are clear. The visualized skeletal structures are unremarkable. IMPRESSION: No active cardiopulmonary disease. Electronically Signed   By: Obie Dredge M.D.   On: 11/28/2022 10:25    Procedures Procedures (including critical care time)  Medications Ordered in UC Medications  alum & mag hydroxide-simeth (MAALOX/MYLANTA) 200-200-20 MG/5ML suspension 30 mL (30 mLs Oral Given 11/28/22 1006)  lidocaine (XYLOCAINE) 2 % viscous mouth solution 15 mL (15 mLs Mouth/Throat Given 11/28/22 1006)    Initial Impression / Assessment and Plan / UC Course  I have reviewed the triage vital signs and the nursing notes.  Pertinent labs & imaging results that were available during my care of the patient were reviewed by me and considered in my medical decision making (see chart for details).  Chest x-ray was negative for  pneumonia or other active cardiopulmonary disease.  Patient did find relief after the GI cocktail was administered.  Suspect patient may be experiencing an acute sinusitis, and experiencing reflux symptoms as well.  Will treat patient with Augmentin 875/125 mg tablets for the next 5 days, and for her reflux, Mylanta extra strength will be provided.  Supportive care recommendations were provided and discussed with the patient to include over-the-counter analgesics, increasing fluids, rest, normal saline nasal spray, avoiding triggers that may exacerbate her reflux symptoms, and continuing to eat in smaller amounts.  Patient was advised to follow-up with her PCP if symptoms do not improve with this treatment.  Patient was in agreement with this plan of care and verbalizes understanding.  All questions were answered.  Patient stable for discharge. Final Clinical Impressions(s) / UC Diagnoses   Final diagnoses:  Acute sinusitis, recurrence not specified, unspecified location  Symptoms of gastroesophageal reflux     Discharge Instructions      The chest x-ray was negative for pneumonia. Take medication as prescribed. Increase fluids and allow for plenty of rest. May take over-the-counter ibuprofen or Tylenol as needed for pain, fever, or general discomfort. Normal saline nasal spray throughout the day to help with nasal congestion and runny nose. Try to avoid foods that may trigger reflux symptoms to include caffeine, dairy, spicy foods, tomato-based foods, or mint based foods. Continue to eat smaller meal sizes and chew your food well before swallowing. As discussed, if symptoms do not improve with this treatment, please follow-up with your primary care physician for further evaluation.  Follow-up as needed.     ED Prescriptions     Medication Sig Dispense Auth. Provider   amoxicillin-clavulanate (AUGMENTIN) 875-125 MG tablet Take 1 tablet by mouth every 12 (twelve) hours for 7 days. 14 tablet  Leath-Warren, Sadie Haber, NP   alum & mag hydroxide-simeth (MYLANTA MAXIMUM STRENGTH) 400-400-40 MG/5ML suspension Take 10 mLs by mouth every 6 (six) hours as needed for indigestion. 355 mL Leath-Warren, Sadie Haber, NP      PDMP not reviewed this encounter.   Abran Cantor, NP 11/28/22 1047

## 2023-01-03 ENCOUNTER — Other Ambulatory Visit (HOSPITAL_COMMUNITY): Payer: Self-pay

## 2023-01-03 ENCOUNTER — Telehealth: Payer: Self-pay | Admitting: Pharmacy Technician

## 2023-01-03 NOTE — Telephone Encounter (Signed)
Pharmacy Patient Advocate Encounter   Received notification from CoverMyMeds that prior authorization for nexlizet is required/requested.   Insurance verification completed.   The patient is insured through [employer] .   Per test claim: PA required; PA submitted to above mentioned insurance via CoverMyMeds Key/confirmation #/EOC [id number] Status is pending

## 2023-01-03 NOTE — Telephone Encounter (Signed)
Pharmacy Patient Advocate Encounter  Received notification from [facility] that Prior Authorization for nexlizet has been APPROVED from 01/04/22 to 01/04/24   PA #/Case ID/Reference #: [id number]

## 2023-01-12 DIAGNOSIS — L409 Psoriasis, unspecified: Secondary | ICD-10-CM | POA: Diagnosis not present

## 2023-01-12 DIAGNOSIS — L4050 Arthropathic psoriasis, unspecified: Secondary | ICD-10-CM | POA: Diagnosis not present

## 2023-01-26 DIAGNOSIS — Z08 Encounter for follow-up examination after completed treatment for malignant neoplasm: Secondary | ICD-10-CM | POA: Diagnosis not present

## 2023-01-26 DIAGNOSIS — Z1283 Encounter for screening for malignant neoplasm of skin: Secondary | ICD-10-CM | POA: Diagnosis not present

## 2023-01-26 DIAGNOSIS — D225 Melanocytic nevi of trunk: Secondary | ICD-10-CM | POA: Diagnosis not present

## 2023-01-26 DIAGNOSIS — Z85820 Personal history of malignant melanoma of skin: Secondary | ICD-10-CM | POA: Diagnosis not present

## 2023-01-26 DIAGNOSIS — D485 Neoplasm of uncertain behavior of skin: Secondary | ICD-10-CM | POA: Diagnosis not present

## 2023-03-16 ENCOUNTER — Ambulatory Visit: Payer: Medicare PPO | Admitting: Internal Medicine

## 2023-03-24 DIAGNOSIS — G894 Chronic pain syndrome: Secondary | ICD-10-CM | POA: Diagnosis not present

## 2023-03-24 DIAGNOSIS — G629 Polyneuropathy, unspecified: Secondary | ICD-10-CM | POA: Diagnosis not present

## 2023-03-24 DIAGNOSIS — L4050 Arthropathic psoriasis, unspecified: Secondary | ICD-10-CM | POA: Diagnosis not present

## 2023-05-05 DIAGNOSIS — M79675 Pain in left toe(s): Secondary | ICD-10-CM | POA: Diagnosis not present

## 2023-05-05 DIAGNOSIS — M79674 Pain in right toe(s): Secondary | ICD-10-CM | POA: Diagnosis not present

## 2023-05-05 DIAGNOSIS — B351 Tinea unguium: Secondary | ICD-10-CM | POA: Diagnosis not present

## 2023-05-13 ENCOUNTER — Encounter: Payer: Self-pay | Admitting: Physician Assistant

## 2023-05-13 ENCOUNTER — Ambulatory Visit: Attending: Student | Admitting: Physician Assistant

## 2023-05-13 VITALS — BP 130/70 | HR 74 | Ht 64.0 in | Wt 151.4 lb

## 2023-05-13 DIAGNOSIS — R011 Cardiac murmur, unspecified: Secondary | ICD-10-CM

## 2023-05-13 DIAGNOSIS — E7849 Other hyperlipidemia: Secondary | ICD-10-CM | POA: Diagnosis not present

## 2023-05-13 DIAGNOSIS — E559 Vitamin D deficiency, unspecified: Secondary | ICD-10-CM

## 2023-05-13 DIAGNOSIS — Z79899 Other long term (current) drug therapy: Secondary | ICD-10-CM

## 2023-05-13 DIAGNOSIS — R931 Abnormal findings on diagnostic imaging of heart and coronary circulation: Secondary | ICD-10-CM | POA: Diagnosis not present

## 2023-05-13 DIAGNOSIS — Z789 Other specified health status: Secondary | ICD-10-CM

## 2023-05-13 NOTE — Patient Instructions (Signed)
 Medication Instructions:  Your physician recommends that you continue on your current medications as directed. Please refer to the Current Medication list given to you today.  *If you need a refill on your cardiac medications before your next appointment, please call your pharmacy*  Lab Work: Vitamin D CBC CMP FLP  If you have labs (blood work) drawn today and your tests are completely normal, you will receive your results only by: MyChart Message (if you have MyChart) OR A paper copy in the mail If you have any lab test that is abnormal or we need to change your treatment, we will call you to review the results.  Testing/Procedures: Your physician has requested that you have an echocardiogram. Echocardiography is a painless test that uses sound waves to create images of your heart. It provides your doctor with information about the size and shape of your heart and how well your heart's chambers and valves are working. This procedure takes approximately one hour. There are no restrictions for this procedure. Please do NOT wear cologne, perfume, aftershave, or lotions (deodorant is allowed). Please arrive 15 minutes prior to your appointment time.  Please note: We ask at that you not bring children with you during ultrasound (echo/ vascular) testing. Due to room size and safety concerns, children are not allowed in the ultrasound rooms during exams. Our front office staff cannot provide observation of children in our lobby area while testing is being conducted. An adult accompanying a patient to their appointment will only be allowed in the ultrasound room at the discretion of the ultrasound technician under special circumstances. We apologize for any inconvenience.   Follow-Up: At [facility], you and your health needs are our priority.  As part of our continuing mission to provide you with exceptional heart care, our providers are all part of one team.  This team includes your  primary Cardiologist (physician) and Advanced Practice Providers or APPs (Physician Assistants and Nurse Practitioners) who all work together to provide you with the care you need, when you need it.  Your next appointment:   6 month(s)  Provider:   You may see Vishnu P Mallipeddi, MD or one of the following Advanced Practice Providers on your designated Care Team:   [place], PA-C  [place], [place] Theotis Flake, [place]     We recommend signing up for the patient portal called "MyChart".  Sign up information is provided on this After Visit Summary.  MyChart is used to connect with patients for Virtual Visits (Telemedicine).  Patients are able to view lab/test results, encounter notes, upcoming appointments, etc.  Non-urgent messages can be sent to your provider as well.   To learn more about what you can do with MyChart, go to [email or web address].   Other Instructions

## 2023-05-13 NOTE — Progress Notes (Signed)
 Cardiology Office Note:  .   Date:  05/13/2023  ID:  Savannah Watson, DOB 1950-04-18, MRN [id number] PCP: Minus Amel, MD  [facility] Providers Cardiologist:  Lasalle Pointer, MD {  History of Present Illness: .   Savannah Watson is a 73 y.o. female with PMH of elevated coronary calcium score (09/2022 317), HLD/severe hypercholesterolemia, psoriatic arthritis presents to the office visit for 2-month follow-up.   Last seen in heart care 09/2022 with Dr. Mallipeddi for new patient evaluation of HDL and elevated coronary calcium score. At that time, no cardiac complaints.  Labs from 06/2022 showed LDL 202, TG 95, total cholesterol 275.  Noted that she previously tried atorvastatin but d/c due to severe myalgia.  Has not tried different statin due to ongoing muscle aches from psoriatic arthritis.  Patient agreed to start on bempedoic acid -ezetimibe  180 mg-10 mg once daily in 11/2022.  Reviewed CAC score from 09/2022 was 317 (84% of age and sex matched control).  Started on ASA 81 mg daily.  Recommended healthy diet and exercise.  Recommended repeat FLP in 6 months before next visit, however, no recent labs located.   Today, denied any chest pain, shortness of breath, palpitations, syncope, presyncope, dizziness, orthopnea, PND, swelling or significant weight changes, acute bleeding, or claudication.   Endorses healthy diet. Very active with raising 7 grandkids. Able to walk around stores without any concerns. Concerned about gaining weight with Lyrica, however seem to have lost 7 lbs since last OV 09/2022. Denied tobacco use, binging ETOH, or drug use.   Studies Reviewed: Aaron Aas       ECHO 09/10/2022 IMPRESSIONS   1. Left ventricular ejection fraction, by estimation, is 65 to 70%. The  left ventricle has normal function. The left ventricle has no regional  wall motion abnormalities. Left ventricular diastolic parameters are  consistent with Grade I diastolic  dysfunction (impaired relaxation).   2.  Right ventricular systolic function is normal. The right ventricular  size is normal. Tricuspid regurgitation signal is inadequate for assessing  PA pressure.   3. The mitral valve is normal in structure. No evidence of mitral valve  regurgitation. No evidence of mitral stenosis.   4. The aortic valve is tricuspid. Aortic valve regurgitation is not  visualized. No aortic stenosis is present.   5. The inferior vena cava is normal in size with greater than 50%  respiratory variability, suggesting right atrial pressure of 3 mmHg.   CT calcium score 09/15/2022 FINDINGS: Coronary Calcium Score: Left main: 0 Left anterior descending artery: 132 Left circumflex artery: 90.6 Right coronary artery: 94.7 Total: 317 Percentile: 84 Pericardium: Normal. Ascending Aorta: Normal caliber.  Aortic atherosclerosis. IMPRESSION: Coronary calcium score of 317. This was 75 percentile for age-, race-, and sex-matched controls. Aortic atherosclerosis. CT chest IMPRESSION: No significant extracardiac incidental findings identified.  Physical Exam:   VS:  BP 130/70   Pulse 74   Ht 5\' 4"  (1.626 m)   Wt 151 lb 6.4 oz (68.7 kg)   SpO2 96%   BMI 25.99 kg/m    Wt Readings from Last 3 Encounters:  05/13/23 151 lb 6.4 oz (68.7 kg)  10/25/22 158 lb (71.7 kg)  09/29/22 157 lb 3.2 oz (71.3 kg)    GEN: Well nourished, well developed in no acute distress NECK: No JVD; No carotid bruits CARDIAC: RRR, 2/6 systolic ejection murmur in L/R 2nd ICS, no rubs, gallops RESPIRATORY:  Clear to auscultation without rales, wheezing or rhonchi  ABDOMEN: Soft, non-tender,  non-distended EXTREMITIES:  No edema; No deformity   ASSESSMENT AND PLAN: .    Systolic Ejection murmur  - Previous ECHO 09/2022: no valve abnormalities; Also no previous murmur on exams.  - 2/6 systolic ejection murmur in L/R 2nd ICS - Ordered CBC to r/o anemia contributing to murmur.  - Ordered ECHO. Discussed waiting until 09/2023 in case not  covered by insurance but patient prefers to proceed with ECHO earlier.   Elevated coronary calcium score  - 09/2022 317 - 09/2022 ECHO: EF 65 to 70%, no RWMA, G1DD, normal left and right ventricle function - Continue ASA 81 mg and bempedoic acid -ezetimibe  180-10 mg daily  - Will consider coronary CTA if outpatient ischemic evaluation indicated in the future  HLD/severe hypercholesterolemia Statin intolerances  - 06/2022: LDL 202, TG 95, total cholesterol 275. - Order FLP and CMP  - Continue bempedoic acid -ezetimibe  as above  - If LDL > 100, can consider PCK9i or Leqvio  Vitamin D Deficiency  - Previous Vit D 19 in 06/2022. PCP treated with Vit D. Patient wants follow up Vit D level. Ordered Vitamin D.   Dispo: Ordered CBC/FLP/CMP/Vitamin D, ECHO , F/U in 6 months  Signed, Metta Actis, PA-C

## 2023-05-18 ENCOUNTER — Encounter: Payer: Self-pay | Admitting: Physician Assistant

## 2023-05-18 DIAGNOSIS — E7849 Other hyperlipidemia: Secondary | ICD-10-CM | POA: Diagnosis not present

## 2023-05-19 ENCOUNTER — Ambulatory Visit: Payer: Self-pay | Admitting: Physician Assistant

## 2023-05-19 LAB — CBC
Hematocrit: 42.2 % (ref 34.0–46.6)
Hemoglobin: 13.4 g/dL (ref 11.1–15.9)
MCH: 29.1 pg (ref 26.6–33.0)
MCHC: 31.8 g/dL (ref 31.5–35.7)
MCV: 92 fL (ref 79–97)
Platelets: 302 10*3/uL (ref 150–450)
RBC: 4.61 x10E6/uL (ref 3.77–5.28)
RDW: 13.1 % (ref 11.7–15.4)
WBC: 4.8 10*3/uL (ref 3.4–10.8)

## 2023-05-19 LAB — COMPREHENSIVE METABOLIC PANEL WITH GFR
ALT: 19 IU/L (ref 0–32)
AST: 17 IU/L (ref 0–40)
Albumin: 4.5 g/dL (ref 3.8–4.8)
Alkaline Phosphatase: 61 IU/L (ref 44–121)
BUN/Creatinine Ratio: 23 (ref 12–28)
BUN: 13 mg/dL (ref 8–27)
Bilirubin Total: 0.2 mg/dL (ref 0.0–1.2)
CO2: 24 mmol/L (ref 20–29)
Calcium: 9.2 mg/dL (ref 8.7–10.3)
Chloride: 104 mmol/L (ref 96–106)
Creatinine, Ser: 0.56 mg/dL — ABNORMAL LOW (ref 0.57–1.00)
Globulin, Total: 2.7 g/dL (ref 1.5–4.5)
Glucose: 103 mg/dL — ABNORMAL HIGH (ref 70–99)
Potassium: 4.4 mmol/L (ref 3.5–5.2)
Sodium: 142 mmol/L (ref 134–144)
Total Protein: 7.2 g/dL (ref 6.0–8.5)
eGFR: 97 mL/min/{1.73_m2} (ref >59)

## 2023-05-19 LAB — VITAMIN D 25 HYDROXY (VIT D DEFICIENCY, FRACTURES): Vit D, 25-Hydroxy: 32.5 ng/mL (ref 30.0–100.0)

## 2023-05-19 LAB — LIPID PANEL
Chol/HDL Ratio: 3.5 ratio (ref 0.0–4.4)
Cholesterol, Total: 151 mg/dL (ref 100–199)
HDL: 43 mg/dL (ref >39)
LDL Chol Calc (NIH): 91 mg/dL (ref 0–99)
Triglycerides: 89 mg/dL (ref 0–149)
VLDL Cholesterol Cal: 17 mg/dL (ref 5–40)

## 2023-05-31 DIAGNOSIS — G629 Polyneuropathy, unspecified: Secondary | ICD-10-CM | POA: Diagnosis not present

## 2023-05-31 DIAGNOSIS — R202 Paresthesia of skin: Secondary | ICD-10-CM | POA: Diagnosis not present

## 2023-05-31 DIAGNOSIS — R799 Abnormal finding of blood chemistry, unspecified: Secondary | ICD-10-CM | POA: Diagnosis not present

## 2023-06-02 DIAGNOSIS — G629 Polyneuropathy, unspecified: Secondary | ICD-10-CM | POA: Diagnosis not present

## 2023-06-02 DIAGNOSIS — M5418 Radiculopathy, sacral and sacrococcygeal region: Secondary | ICD-10-CM | POA: Diagnosis not present

## 2023-06-13 ENCOUNTER — Telehealth: Payer: Self-pay | Admitting: Physician Assistant

## 2023-06-13 ENCOUNTER — Ambulatory Visit (HOSPITAL_COMMUNITY)
Admission: RE | Admit: 2023-06-13 | Discharge: 2023-06-13 | Disposition: A | Discharge status: Home / Self Care | Source: Ambulatory Visit | Attending: Physician Assistant | Admitting: Physician Assistant

## 2023-06-13 DIAGNOSIS — R011 Cardiac murmur, unspecified: Secondary | ICD-10-CM | POA: Diagnosis not present

## 2023-06-13 LAB — ECHOCARDIOGRAM COMPLETE
AR max vel: 1.63 cm2
AV Area VTI: 1.8 cm2
AV Area mean vel: 1.79 cm2
AV Mean grad: 8 mmHg
AV Peak grad: 16.3 mmHg
Ao pk vel: 2.02 m/s
Area-P 1/2: 2.48 cm2
S' Lateral: 2.4 cm

## 2023-06-13 NOTE — Progress Notes (Signed)
*  PRELIMINARY RESULTS* Echocardiogram 2D Echocardiogram has been performed.  Bernis Brisker 06/13/2023, 10:26 AM

## 2023-06-13 NOTE — Telephone Encounter (Signed)
I will forward to provider 

## 2023-06-13 NOTE — Telephone Encounter (Signed)
 Patient has more questions about her echo results she would like to discuss with the PA.

## 2023-06-14 DIAGNOSIS — M5418 Radiculopathy, sacral and sacrococcygeal region: Secondary | ICD-10-CM | POA: Diagnosis not present

## 2023-06-14 DIAGNOSIS — G629 Polyneuropathy, unspecified: Secondary | ICD-10-CM | POA: Diagnosis not present

## 2023-06-14 DIAGNOSIS — R202 Paresthesia of skin: Secondary | ICD-10-CM | POA: Diagnosis not present

## 2023-06-14 DIAGNOSIS — Z7982 Long term (current) use of aspirin: Secondary | ICD-10-CM | POA: Diagnosis not present

## 2023-06-14 DIAGNOSIS — M1990 Unspecified osteoarthritis, unspecified site: Secondary | ICD-10-CM | POA: Diagnosis not present

## 2023-06-14 DIAGNOSIS — Z79890 Hormone replacement therapy (postmenopausal): Secondary | ICD-10-CM | POA: Diagnosis not present

## 2023-06-14 DIAGNOSIS — Z888 Allergy status to other drugs, medicaments and biological substances status: Secondary | ICD-10-CM | POA: Diagnosis not present

## 2023-06-14 DIAGNOSIS — L4050 Arthropathic psoriasis, unspecified: Secondary | ICD-10-CM | POA: Diagnosis not present

## 2023-06-14 DIAGNOSIS — G894 Chronic pain syndrome: Secondary | ICD-10-CM | POA: Diagnosis not present

## 2023-06-14 DIAGNOSIS — Z881 Allergy status to other antibiotic agents status: Secondary | ICD-10-CM | POA: Diagnosis not present

## 2023-07-14 DIAGNOSIS — Z780 Asymptomatic menopausal state: Secondary | ICD-10-CM | POA: Diagnosis not present

## 2023-07-14 DIAGNOSIS — M8580 Other specified disorders of bone density and structure, unspecified site: Secondary | ICD-10-CM | POA: Diagnosis not present

## 2023-07-14 DIAGNOSIS — L4050 Arthropathic psoriasis, unspecified: Secondary | ICD-10-CM | POA: Diagnosis not present

## 2023-07-14 DIAGNOSIS — L409 Psoriasis, unspecified: Secondary | ICD-10-CM | POA: Diagnosis not present

## 2023-08-22 DIAGNOSIS — M79605 Pain in left leg: Secondary | ICD-10-CM | POA: Diagnosis not present

## 2023-08-22 DIAGNOSIS — Z780 Asymptomatic menopausal state: Secondary | ICD-10-CM | POA: Diagnosis not present

## 2023-08-22 DIAGNOSIS — M5418 Radiculopathy, sacral and sacrococcygeal region: Secondary | ICD-10-CM | POA: Diagnosis not present

## 2023-08-22 DIAGNOSIS — Z131 Encounter for screening for diabetes mellitus: Secondary | ICD-10-CM | POA: Diagnosis not present

## 2023-08-22 DIAGNOSIS — M79652 Pain in left thigh: Secondary | ICD-10-CM | POA: Diagnosis not present

## 2023-08-22 DIAGNOSIS — M85852 Other specified disorders of bone density and structure, left thigh: Secondary | ICD-10-CM | POA: Diagnosis not present

## 2023-08-22 DIAGNOSIS — M8580 Other specified disorders of bone density and structure, unspecified site: Secondary | ICD-10-CM | POA: Diagnosis not present

## 2023-08-22 DIAGNOSIS — G629 Polyneuropathy, unspecified: Secondary | ICD-10-CM | POA: Diagnosis not present

## 2023-08-22 DIAGNOSIS — M79672 Pain in left foot: Secondary | ICD-10-CM | POA: Diagnosis not present

## 2023-08-22 DIAGNOSIS — M79671 Pain in right foot: Secondary | ICD-10-CM | POA: Diagnosis not present

## 2023-09-02 DIAGNOSIS — Z79899 Other long term (current) drug therapy: Secondary | ICD-10-CM | POA: Diagnosis not present

## 2023-09-02 DIAGNOSIS — M5416 Radiculopathy, lumbar region: Secondary | ICD-10-CM | POA: Diagnosis not present

## 2023-09-03 LAB — CBC
Hematocrit: 42.7 % (ref 34.0–46.6)
Hemoglobin: 13.9 g/dL (ref 11.1–15.9)
MCH: 29.6 pg (ref 26.6–33.0)
MCHC: 32.6 g/dL (ref 31.5–35.7)
MCV: 91 fL (ref 79–97)
Platelets: 287 x10E3/uL (ref 150–450)
RBC: 4.69 x10E6/uL (ref 3.77–5.28)
RDW: 13.4 % (ref 11.7–15.4)
WBC: 4.1 x10E3/uL (ref 3.4–10.8)

## 2023-09-03 LAB — COMPREHENSIVE METABOLIC PANEL WITH GFR
ALT: 29 IU/L (ref 0–32)
AST: 23 IU/L (ref 0–40)
Albumin: 4.8 g/dL (ref 3.8–4.8)
Alkaline Phosphatase: 70 IU/L (ref 44–121)
BUN/Creatinine Ratio: 21 (ref 12–28)
BUN: 12 mg/dL (ref 8–27)
Bilirubin Total: 0.3 mg/dL (ref 0.0–1.2)
CO2: 24 mmol/L (ref 20–29)
Calcium: 9.8 mg/dL (ref 8.7–10.3)
Chloride: 103 mmol/L (ref 96–106)
Creatinine, Ser: 0.58 mg/dL (ref 0.57–1.00)
Globulin, Total: 2.9 g/dL (ref 1.5–4.5)
Glucose: 103 mg/dL — ABNORMAL HIGH (ref 70–99)
Potassium: 5.1 mmol/L (ref 3.5–5.2)
Sodium: 143 mmol/L (ref 134–144)
Total Protein: 7.7 g/dL (ref 6.0–8.5)
eGFR: 95 mL/min/1.73 (ref >59)

## 2023-09-03 LAB — VITAMIN D 25 HYDROXY (VIT D DEFICIENCY, FRACTURES): Vit D, 25-Hydroxy: 38.3 ng/mL (ref 30.0–100.0)

## 2023-09-14 DIAGNOSIS — M5416 Radiculopathy, lumbar region: Secondary | ICD-10-CM | POA: Diagnosis not present

## 2023-09-21 DIAGNOSIS — H43391 Other vitreous opacities, right eye: Secondary | ICD-10-CM | POA: Diagnosis not present

## 2023-09-21 DIAGNOSIS — Z961 Presence of intraocular lens: Secondary | ICD-10-CM | POA: Diagnosis not present

## 2023-09-21 DIAGNOSIS — H04123 Dry eye syndrome of bilateral lacrimal glands: Secondary | ICD-10-CM | POA: Diagnosis not present

## 2023-09-21 DIAGNOSIS — H43812 Vitreous degeneration, left eye: Secondary | ICD-10-CM | POA: Diagnosis not present

## 2023-10-05 ENCOUNTER — Other Ambulatory Visit (HOSPITAL_COMMUNITY): Payer: Self-pay | Admitting: Neurology

## 2023-10-05 DIAGNOSIS — R29898 Other symptoms and signs involving the musculoskeletal system: Secondary | ICD-10-CM

## 2023-10-05 DIAGNOSIS — M5418 Radiculopathy, sacral and sacrococcygeal region: Secondary | ICD-10-CM

## 2023-10-13 ENCOUNTER — Other Ambulatory Visit: Payer: Self-pay | Admitting: Internal Medicine

## 2023-10-14 ENCOUNTER — Ambulatory Visit (HOSPITAL_COMMUNITY)
Admission: RE | Admit: 2023-10-14 | Discharge: 2023-10-14 | Disposition: A | Discharge status: Home / Self Care | Source: Ambulatory Visit | Attending: Neurology | Admitting: Neurology

## 2023-10-14 DIAGNOSIS — M47816 Spondylosis without myelopathy or radiculopathy, lumbar region: Secondary | ICD-10-CM

## 2023-10-14 DIAGNOSIS — M51369 Other intervertebral disc degeneration, lumbar region without mention of lumbar back pain or lower extremity pain: Secondary | ICD-10-CM

## 2023-10-14 DIAGNOSIS — M51379 Other intervertebral disc degeneration, lumbosacral region without mention of lumbar back pain or lower extremity pain: Secondary | ICD-10-CM

## 2023-10-14 DIAGNOSIS — R29898 Other symptoms and signs involving the musculoskeletal system: Secondary | ICD-10-CM | POA: Insufficient documentation

## 2023-10-14 DIAGNOSIS — M5418 Radiculopathy, sacral and sacrococcygeal region: Secondary | ICD-10-CM | POA: Insufficient documentation

## 2023-10-17 NOTE — Telephone Encounter (Signed)
 Called patient to follow up on message regarding pain. No answer x2, responded to mychart message.

## 2023-10-19 LAB — VITAMIN D 25 HYDROXY (VIT D DEFICIENCY, FRACTURES): Vit D, 25-Hydroxy: 32 ng/mL (ref 30.0–100.0)

## 2023-10-25 LAB — CBC

## 2024-01-17 ENCOUNTER — Other Ambulatory Visit (HOSPITAL_COMMUNITY): Payer: Self-pay

## 2024-01-17 DIAGNOSIS — Z1231 Encounter for screening mammogram for malignant neoplasm of breast: Secondary | ICD-10-CM

## 2024-01-31 ENCOUNTER — Telehealth: Payer: Self-pay | Admitting: Family Medicine

## 2024-01-31 ENCOUNTER — Ambulatory Visit

## 2024-01-31 NOTE — Telephone Encounter (Signed)
 Yes, okay to put her back on schedule for 2/10

## 2024-01-31 NOTE — Telephone Encounter (Signed)
 Copied from CRM #[id number]. Topic: Appointments - Scheduling Inquiry for Clinic >> Jan 30, 2024 12:07 PM Edsel HERO wrote: Patient called to confirm her new patient appointment on 1/27 that she was sent from the waitlist on 1/26. Office is operating on a delay due to weather however patient did not know that when she accepted the appointment on 1/26. Original appointment was scheduled for 2/10. Since this was a system error and waitlist notification should have never been sent out can patient be worked back into her appointment on 2/10? Please advise.

## 2024-02-01 NOTE — Telephone Encounter (Signed)
 Scheduled on 2.19 the 2.10 unavailable

## 2024-02-14 ENCOUNTER — Ambulatory Visit: Payer: Self-pay

## 2024-02-22 ENCOUNTER — Ambulatory Visit (HOSPITAL_COMMUNITY)

## 2024-02-23 ENCOUNTER — Ambulatory Visit: Payer: Self-pay
